# Patient Record
Sex: Female | Born: 1975 | Race: White | Hispanic: No | Marital: Married | Smoking: Former smoker
Health system: Southern US, Community
[De-identification: ages and names within clinical notes are randomized; demographics above are authoritative.]

## PROBLEM LIST (undated history)

## (undated) DIAGNOSIS — F99 Mental disorder, not otherwise specified: Secondary | ICD-10-CM

## (undated) HISTORY — PX: OTHER SURGICAL HISTORY: SHX169

## (undated) HISTORY — DX: Mental disorder, not otherwise specified: F99

---

## 2000-08-24 ENCOUNTER — Other Ambulatory Visit: Admission: RE | Admit: 2000-08-24 | Discharge: 2000-08-24 | Payer: Self-pay | Admitting: Obstetrics and Gynecology

## 2004-08-19 ENCOUNTER — Ambulatory Visit (HOSPITAL_COMMUNITY): Admission: RE | Admit: 2004-08-19 | Discharge: 2004-08-19 | Payer: Self-pay | Admitting: Obstetrics & Gynecology

## 2006-12-08 ENCOUNTER — Ambulatory Visit (HOSPITAL_COMMUNITY): Admission: RE | Admit: 2006-12-08 | Discharge: 2006-12-08 | Payer: Self-pay | Admitting: Internal Medicine

## 2006-12-20 ENCOUNTER — Ambulatory Visit (HOSPITAL_COMMUNITY): Admission: RE | Admit: 2006-12-20 | Discharge: 2006-12-20 | Payer: Self-pay | Admitting: Internal Medicine

## 2007-06-15 ENCOUNTER — Other Ambulatory Visit: Admission: RE | Admit: 2007-06-15 | Discharge: 2007-06-15 | Payer: Self-pay | Admitting: Obstetrics and Gynecology

## 2008-05-22 ENCOUNTER — Other Ambulatory Visit: Admission: RE | Admit: 2008-05-22 | Discharge: 2008-05-22 | Payer: Self-pay | Admitting: Obstetrics & Gynecology

## 2008-11-24 ENCOUNTER — Inpatient Hospital Stay (HOSPITAL_COMMUNITY): Admission: AD | Admit: 2008-11-24 | Discharge: 2008-11-26 | Payer: Self-pay | Admitting: Obstetrics and Gynecology

## 2009-08-12 ENCOUNTER — Other Ambulatory Visit: Admission: RE | Admit: 2009-08-12 | Discharge: 2009-08-12 | Payer: Self-pay | Admitting: Obstetrics and Gynecology

## 2010-05-21 LAB — CBC
HCT: 37.6 % (ref 36.0–46.0)
Hemoglobin: 12.7 g/dL (ref 12.0–15.0)
Hemoglobin: 13.8 g/dL (ref 12.0–15.0)
MCV: 95.6 fL (ref 78.0–100.0)
MCV: 96.6 fL (ref 78.0–100.0)
Platelets: 205 10*3/uL (ref 150–400)
RDW: 13.3 % (ref 11.5–15.5)

## 2010-05-21 LAB — RPR: RPR Ser Ql: NONREACTIVE

## 2010-09-16 ENCOUNTER — Other Ambulatory Visit (HOSPITAL_COMMUNITY)
Admission: RE | Admit: 2010-09-16 | Discharge: 2010-09-16 | Disposition: A | Discharge status: Home / Self Care | Payer: BC Managed Care – PPO | Source: Ambulatory Visit | Attending: Obstetrics and Gynecology | Admitting: Obstetrics and Gynecology

## 2010-09-16 DIAGNOSIS — Z01419 Encounter for gynecological examination (general) (routine) without abnormal findings: Secondary | ICD-10-CM | POA: Insufficient documentation

## 2011-09-20 ENCOUNTER — Other Ambulatory Visit (HOSPITAL_COMMUNITY)
Admission: RE | Admit: 2011-09-20 | Discharge: 2011-09-20 | Disposition: A | Discharge status: Home / Self Care | Payer: BC Managed Care – PPO | Source: Ambulatory Visit | Attending: Obstetrics and Gynecology | Admitting: Obstetrics and Gynecology

## 2011-09-20 DIAGNOSIS — Z01419 Encounter for gynecological examination (general) (routine) without abnormal findings: Secondary | ICD-10-CM | POA: Insufficient documentation

## 2011-09-20 DIAGNOSIS — Z1151 Encounter for screening for human papillomavirus (HPV): Secondary | ICD-10-CM | POA: Insufficient documentation

## 2012-10-10 ENCOUNTER — Other Ambulatory Visit: Payer: Self-pay | Admitting: Obstetrics & Gynecology

## 2012-10-19 ENCOUNTER — Other Ambulatory Visit (HOSPITAL_COMMUNITY)
Admission: RE | Admit: 2012-10-19 | Discharge: 2012-10-19 | Disposition: A | Discharge status: Home / Self Care | Payer: BC Managed Care – PPO | Source: Ambulatory Visit | Attending: Obstetrics & Gynecology | Admitting: Obstetrics & Gynecology

## 2012-10-19 ENCOUNTER — Ambulatory Visit (INDEPENDENT_AMBULATORY_CARE_PROVIDER_SITE_OTHER): Payer: BC Managed Care – PPO | Admitting: Obstetrics & Gynecology

## 2012-10-19 ENCOUNTER — Encounter: Payer: Self-pay | Admitting: Obstetrics & Gynecology

## 2012-10-19 VITALS — BP 100/70 | Ht 65.2 in | Wt 145.0 lb

## 2012-10-19 DIAGNOSIS — Z01419 Encounter for gynecological examination (general) (routine) without abnormal findings: Secondary | ICD-10-CM

## 2012-10-19 DIAGNOSIS — Z1151 Encounter for screening for human papillomavirus (HPV): Secondary | ICD-10-CM | POA: Insufficient documentation

## 2012-10-19 MED ORDER — ESCITALOPRAM OXALATE 10 MG PO TABS: 10.0000 mg | ORAL_TABLET | Freq: Every day | ORAL | Status: DC

## 2012-10-19 NOTE — Patient Instructions (Signed)
Premenstrual Syndrome Premenstrual syndrome (PMS) is a condition that consists of physical, emotional, and behavioral symptoms that affect women of childbearing age. PMS occurs 5 14 days before the start of a menstrual period and often recurs in a predictable pattern. The symptoms go away a few days after the menstrual period starts. PMS can interfere in many ways with normal daily activities and can range from mild to severe. When PMS is considered severe, it may be diagnosed as premenstrual dysphoric disorder (PMDD). A small percentage of women are affected by PMS symptoms and an even smaller percentage of those women are affected by PMDD.  CAUSES  The exact cause of PMS is unknown, but it seems to be related to cyclic hormone changes that happen before menstruation. These hormones are thought to affect chemicals in the brain (serotonin) that can influence a person's mood.  SYMPTOMS  Symptoms of PMS recur consistently from month to month and go away completely after the menstrual period starts. The most common emotional or behavioral symptom is mood swings. These mood swings can be disabling and interfere with normal activities of daily living. Other common symptoms include depression and angry outbursts. Other symptoms may include:   Irritability.  Anxiety.  Crying spells.   Food cravings or appetite changes.   Changes in sexual desire.   Confusion.   Aggression.   Social withdrawal.   Poor concentration. The most common physical symptoms include a sense of bloating, breast pain, headaches, and extreme fatigue. Other physical symptoms include:   Backaches.   Swelling of the hands and feet.   Weight gain.   Hot flashes.  DIAGNOSIS  To make a diagnosis, your caregiver will ask questions to confirm that you are having a pattern of symptoms. Symptoms must:   Be present 5 days before the start of your period and be present at least 3 months in a row.   End within 4 days  after your period starts.   Interfere with some of your normal activities.  Other conditions, such as thyroid disease, depression, and migraine headaches must be ruled out before a diagnosis of PMS is confirmed.  TREATMENT  Your caregiver may suggest ways to maintain a healthy lifestyle, such as exercise. Over-the-counter pain relievers may ease cramps, aches, pains, headaches, and breast tenderness. However, selective serotonin reuptake inhibitors (SSRIs) are medicines that are most beneficial in improving PMS if taken in the second half of the monthly cycle. They may be taken on a daily basis. The most effective oral contraceptive pill used for symptoms of PMS is one that contains the ingredient drospirenone. Taking 4 days off of the pill instead of the usual 7 days also has shown to increase effectiveness.  There are a number of drugs, dietary supplements, vitamins, and water pills (diuretics) which have been suggested to be helpful but have not shown to be of any benefit to improving PMS symptoms.  HOME CARE INSTRUCTIONS   For 2 3 months, write down your symptoms, their severity, and how long they last. This may help your caregiver prescribe the best treatment for your symptoms.  Exercise regularly as suggested by your caregiver.  Eat a regular, well-balanced diet.  Avoid caffeine, alcohol, and tobacco consumption.  Limit salt and salty foods to lessen bloating and fluid retention.  Get enough sleep. Practice relaxation techniques.  Drink enough fluids to keep your urine clear or pale yellow.  Take medicines as directed by your caregiver.  Limit stress.  Take a multivitamin as directed   by your caregiver. Document Released: 01/30/2000 Document Revised: 10/27/2011 Document Reviewed: 06/21/2011 [employer] Patient Information 2014 [employer].  

## 2012-10-19 NOTE — Progress Notes (Signed)
Patient ID: Zoe Hampton, female   DOB: 09/10/1975, 37 y.o.   MRN: [id number] Subjective:     Zoe Hampton is a 37 y.o. female here for a routine exam.  Patient's last menstrual period was 10/13/2012. No obstetric history on file. Current complaints: PMS.  Personal health questionnaire reviewed: no.   Gynecologic History Patient's last menstrual period was 10/13/2012. Contraception: none Last Pap: 2013. Results were: normal Last mammogram: na. Results were: na  Obstetric History OB History  No data available     The following portions of the patient's history were reviewed and updated as appropriate: allergies, current medications, past family history, past medical history, past social history, past surgical history and problem list.  Review of Systems  Review of Systems  Constitutional: Negative for fever, chills, weight loss, malaise/fatigue and diaphoresis.  HENT: Negative for hearing loss, ear pain, nosebleeds, congestion, sore throat, neck pain, tinnitus and ear discharge.   Eyes: Negative for blurred vision, double vision, photophobia, pain, discharge and redness.  Respiratory: Negative for cough, hemoptysis, sputum production, shortness of breath, wheezing and stridor.   Cardiovascular: Negative for chest pain, palpitations, orthopnea, claudication, leg swelling and PND.  Gastrointestinal: negative for abdominal pain. Negative for heartburn, nausea, vomiting, diarrhea, constipation, blood in stool and melena.  Genitourinary: Negative for dysuria, urgency, frequency, hematuria and flank pain.  Musculoskeletal: Negative for myalgias, back pain, joint pain and falls.  Skin: Negative for itching and rash.  Neurological: Negative for dizziness, tingling, tremors, sensory change, speech change, focal weakness, seizures, loss of consciousness, weakness and headaches.  Endo/Heme/Allergies: Negative for environmental allergies and polydipsia. Does not bruise/bleed easily.   Psychiatric/Behavioral: Negative for depression, suicidal ideas, hallucinations, memory loss and substance abuse. The patient is not nervous/anxious and does not have insomnia.        Objective:    Physical Exam  Vitals reviewed. Constitutional: She is oriented to person, place, and time. She appears well-developed and well-nourished.  HENT:  Head: Normocephalic and atraumatic.        Right Ear: External ear normal.  Left Ear: External ear normal.  Nose: Nose normal.  Mouth/Throat: Oropharynx is clear and moist.  Eyes: Conjunctivae and EOM are normal. Pupils are equal, round, and reactive to light. Right eye exhibits no discharge. Left eye exhibits no discharge. No scleral icterus.  Neck: Normal range of motion. Neck supple. No tracheal deviation present. No thyromegaly present.  Cardiovascular: Normal rate, regular rhythm, normal heart sounds and intact distal pulses.  Exam reveals no gallop and no friction rub.   No murmur heard. Respiratory: Effort normal and breath sounds normal. No respiratory distress. She has no wheezes. She has no rales. She exhibits no tenderness.  GI: Soft. Bowel sounds are normal. She exhibits no distension and no mass. There is no tenderness. There is no rebound and no guarding.  Genitourinary:  Breasts no masses non tender no skin or nipple changes      Vulva is normal without lesions Vagina is pink moist without discharge Cervix normal in appearance and pap is done Uterus is normal size shape and contour Adnexa is negative with normal sized ovaries   Musculoskeletal: Normal range of motion. She exhibits no edema and no tenderness.  Neurological: She is alert and oriented to person, place, and time. She has normal reflexes. She displays normal reflexes. No cranial nerve deficit. She exhibits normal muscle tone. Coordination normal.  Skin: Skin is warm and dry. No rash noted. No erythema. No pallor.  Psychiatric:  She has a normal mood and affect. Her  behavior is normal. Judgment and thought content normal.       Assessment:    Healthy female exam.    Plan:    Follow up in: 1 year. Try Lexapro 10 for PMS

## 2012-12-21 ENCOUNTER — Other Ambulatory Visit: Payer: Self-pay

## 2013-01-08 ENCOUNTER — Encounter: Payer: Self-pay | Admitting: Obstetrics & Gynecology

## 2013-01-08 MED ORDER — ESCITALOPRAM OXALATE 10 MG PO TABS: 10.0000 mg | ORAL_TABLET | Freq: Every day | ORAL | Status: DC

## 2013-12-24 ENCOUNTER — Other Ambulatory Visit (INDEPENDENT_AMBULATORY_CARE_PROVIDER_SITE_OTHER): Payer: Managed Care, Other (non HMO)

## 2013-12-24 ENCOUNTER — Ambulatory Visit (INDEPENDENT_AMBULATORY_CARE_PROVIDER_SITE_OTHER): Payer: Managed Care, Other (non HMO) | Admitting: Obstetrics & Gynecology

## 2013-12-24 ENCOUNTER — Other Ambulatory Visit (HOSPITAL_COMMUNITY)
Admission: RE | Admit: 2013-12-24 | Discharge: 2013-12-24 | Disposition: A | Discharge status: Home / Self Care | Payer: Managed Care, Other (non HMO) | Source: Ambulatory Visit | Attending: Obstetrics & Gynecology | Admitting: Obstetrics & Gynecology

## 2013-12-24 ENCOUNTER — Encounter: Payer: Self-pay | Admitting: Obstetrics & Gynecology

## 2013-12-24 VITALS — BP 120/80 | Ht 65.2 in | Wt 147.4 lb

## 2013-12-24 DIAGNOSIS — Z01419 Encounter for gynecological examination (general) (routine) without abnormal findings: Secondary | ICD-10-CM

## 2013-12-24 DIAGNOSIS — R7989 Other specified abnormal findings of blood chemistry: Secondary | ICD-10-CM

## 2013-12-24 DIAGNOSIS — E7800 Pure hypercholesterolemia, unspecified: Secondary | ICD-10-CM

## 2013-12-24 DIAGNOSIS — E039 Hypothyroidism, unspecified: Secondary | ICD-10-CM

## 2013-12-24 DIAGNOSIS — D509 Iron deficiency anemia, unspecified: Secondary | ICD-10-CM

## 2013-12-24 MED ORDER — ESCITALOPRAM OXALATE 10 MG PO TABS: 10.0000 mg | ORAL_TABLET | Freq: Every day | ORAL | Status: DC

## 2013-12-24 NOTE — Progress Notes (Signed)
Patient ID: Zoe RedCeleste L Hampton, female   DOB: 04/20/1975, 38 y.o.   MRN: [id number] Subjective:     Zoe Hampton is a 38 y.o. female here for a routine exam.  Patient's last menstrual period was 12/22/2013. No obstetric history on file. Birth Control Method:  none Menstrual Calendar(currently): regular  Current complaints: none.   Current acute medical issues:  none   Recent Gynecologic History Patient's last menstrual period was 12/22/2013. Last Pap: 2014,  normal Last mammogram: na,    History reviewed. No pertinent past medical history.  Past Surgical History  Procedure Laterality Date  . Removal of   fibroids      OB History    No data available      History   Social History  . Marital Status: Married    Spouse Name: N/A    Number of Children: N/A  . Years of Education: N/A   Social History Main Topics  . Smoking status: Former Games developermoker  . Smokeless tobacco: None  . Alcohol Use: None  . Drug Use: None  . Sexual Activity: Yes   Other Topics Concern  . None   Social History Narrative    Family History  Problem Relation Age of Onset  . Colon cancer Maternal Grandmother     Current outpatient prescriptions: escitalopram (LEXAPRO) 10 MG tablet, Take 1 tablet (10 mg total) by mouth daily., Disp: 30 tablet, Rfl: 11;  escitalopram (LEXAPRO) 10 MG tablet, Take 1 tablet (10 mg total) by mouth daily., Disp: 30 tablet, Rfl: 2  Review of Systems  Review of Systems  Constitutional: Negative for fever, chills, weight loss, malaise/fatigue and diaphoresis.  HENT: Negative for hearing loss, ear pain, nosebleeds, congestion, sore throat, neck pain, tinnitus and ear discharge.   Eyes: Negative for blurred vision, double vision, photophobia, pain, discharge and redness.  Respiratory: Negative for cough, hemoptysis, sputum production, shortness of breath, wheezing and stridor.   Cardiovascular: Negative for chest pain, palpitations, orthopnea, claudication, leg  swelling and PND.  Gastrointestinal: negative for abdominal pain. Negative for heartburn, nausea, vomiting, diarrhea, constipation, blood in stool and melena.  Genitourinary: Negative for dysuria, urgency, frequency, hematuria and flank pain.  Musculoskeletal: Negative for myalgias, back pain, joint pain and falls.  Skin: Negative for itching and rash.  Neurological: Negative for dizziness, tingling, tremors, sensory change, speech change, focal weakness, seizures, loss of consciousness, weakness and headaches.  Endo/Heme/Allergies: Negative for environmental allergies and polydipsia. Does not bruise/bleed easily.  Psychiatric/Behavioral: Negative for depression, suicidal ideas, hallucinations, memory loss and substance abuse. The patient is not nervous/anxious and does not have insomnia.        Objective:  Blood pressure 120/80, height 5' 5.2" (1.656 m), weight 147 lb 6.4 oz (66.86 kg), last menstrual period 12/22/2013.   Physical Exam  Vitals reviewed. Constitutional: She is oriented to person, place, and time. She appears well-developed and well-nourished.  HENT:  Head: Normocephalic and atraumatic.        Right Ear: External ear normal.  Left Ear: External ear normal.  Nose: Nose normal.  Mouth/Throat: Oropharynx is clear and moist.  Eyes: Conjunctivae and EOM are normal. Pupils are equal, round, and reactive to light. Right eye exhibits no discharge. Left eye exhibits no discharge. No scleral icterus.  Neck: Normal range of motion. Neck supple. No tracheal deviation present. No thyromegaly present.  Cardiovascular: Normal rate, regular rhythm, normal heart sounds and intact distal pulses.  Exam reveals no gallop and no friction rub.   No  murmur heard. Respiratory: Effort normal and breath sounds normal. No respiratory distress. She has no wheezes. She has no rales. She exhibits no tenderness.  GI: Soft. Bowel sounds are normal. She exhibits no distension and no mass. There is no  tenderness. There is no rebound and no guarding.  Genitourinary:  Breasts no masses skin changes or nipple changes bilaterally      Vulva is normal without lesions Vagina is pink moist without discharge Cervix normal in appearance and pap is done Uterus is normal size shape and contour Adnexa is negative with normal sized ovaries   Musculoskeletal: Normal range of motion. She exhibits no edema and no tenderness.  Neurological: She is alert and oriented to person, place, and time. She has normal reflexes. She displays normal reflexes. No cranial nerve deficit. She exhibits normal muscle tone. Coordination normal.  Skin: Skin is warm and dry. No rash noted. No erythema. No pallor.  Psychiatric: She has a normal mood and affect. Her behavior is normal. Judgment and thought content normal.       Assessment:    Healthy female exam.    Plan:    Follow up in: 1 year. lexapro 10

## 2013-12-25 LAB — VITAMIN D 25 HYDROXY (VIT D DEFICIENCY, FRACTURES): Vit D, 25-Hydroxy: 30 ng/mL (ref 30–89)

## 2013-12-25 LAB — CYTOLOGY - PAP

## 2013-12-25 LAB — CBC WITH DIFFERENTIAL/PLATELET
BASOS ABS: 0.1 10*3/uL (ref 0.0–0.1)
Basophils Relative: 1 % (ref 0–1)
EOS ABS: 0.2 10*3/uL (ref 0.0–0.7)
EOS PCT: 2 % (ref 0–5)
HCT: 41.7 % (ref 36.0–46.0)
HEMOGLOBIN: 13.9 g/dL (ref 12.0–15.0)
LYMPHS ABS: 1.8 10*3/uL (ref 0.7–4.0)
LYMPHS PCT: 19 % (ref 12–46)
MCH: 30.4 pg (ref 26.0–34.0)
MCHC: 33.3 g/dL (ref 30.0–36.0)
MCV: 91.2 fL (ref 78.0–100.0)
MONOS PCT: 6 % (ref 3–12)
Monocytes Absolute: 0.6 10*3/uL (ref 0.1–1.0)
Neutro Abs: 6.9 10*3/uL (ref 1.7–7.7)
Neutrophils Relative %: 72 % (ref 43–77)
Platelets: 352 10*3/uL (ref 150–400)
RBC: 4.57 MIL/uL (ref 3.87–5.11)
RDW: 12.8 % (ref 11.5–15.5)
WBC: 9.6 10*3/uL (ref 4.0–10.5)

## 2013-12-25 LAB — LIPID PANEL
CHOLESTEROL: 145 mg/dL (ref 0–200)
HDL: 62 mg/dL (ref >39)
LDL Cholesterol: 73 mg/dL (ref 0–99)
Total CHOL/HDL Ratio: 2.3 Ratio
Triglycerides: 49 mg/dL (ref <150)
VLDL: 10 mg/dL (ref 0–40)

## 2013-12-25 LAB — TSH: TSH: 1.004 u[IU]/mL (ref 0.350–4.500)

## 2014-06-24 ENCOUNTER — Other Ambulatory Visit: Payer: Self-pay

## 2014-06-24 DIAGNOSIS — Z1231 Encounter for screening mammogram for malignant neoplasm of breast: Secondary | ICD-10-CM

## 2014-06-28 ENCOUNTER — Ambulatory Visit
Admission: RE | Admit: 2014-06-28 | Discharge: 2014-06-28 | Disposition: A | Discharge status: Home / Self Care | Payer: Managed Care, Other (non HMO) | Source: Ambulatory Visit

## 2014-06-28 DIAGNOSIS — Z1231 Encounter for screening mammogram for malignant neoplasm of breast: Secondary | ICD-10-CM

## 2014-07-05 ENCOUNTER — Ambulatory Visit: Payer: Managed Care, Other (non HMO)

## 2015-04-17 ENCOUNTER — Encounter: Payer: Self-pay | Admitting: Obstetrics & Gynecology

## 2015-04-17 ENCOUNTER — Ambulatory Visit (INDEPENDENT_AMBULATORY_CARE_PROVIDER_SITE_OTHER): Payer: 59 | Admitting: Obstetrics & Gynecology

## 2015-04-17 ENCOUNTER — Other Ambulatory Visit (HOSPITAL_COMMUNITY)
Admission: RE | Admit: 2015-04-17 | Discharge: 2015-04-17 | Disposition: A | Discharge status: Home / Self Care | Payer: 59 | Source: Ambulatory Visit | Attending: Obstetrics & Gynecology | Admitting: Obstetrics & Gynecology

## 2015-04-17 VITALS — BP 110/70 | HR 76 | Ht 65.2 in | Wt 148.0 lb

## 2015-04-17 DIAGNOSIS — Z01419 Encounter for gynecological examination (general) (routine) without abnormal findings: Secondary | ICD-10-CM

## 2015-04-17 MED ORDER — ESCITALOPRAM OXALATE 10 MG PO TABS: 10.0000 mg | ORAL_TABLET | Freq: Every day | ORAL | Status: DC

## 2015-04-17 NOTE — Progress Notes (Signed)
Patient ID: Zoe Hampton, female   DOB: 27-Sep-1975, 40 y.o.   MRN: [id number] Subjective:     Zoe Hampton is a 40 y.o. female here for a routine exam.  Patient's last menstrual period was 03/31/2015. No obstetric history on file. Birth Control Method:  none Menstrual Calendar(currently): regular  Current complaints: wants to restart her lexapro.   Current acute medical issues:  none   Recent Gynecologic History Patient's last menstrual period was 03/31/2015. Last Pap: 2015,  normal Last mammogram: 2016,  normal  History reviewed. No pertinent past medical history.  Past Surgical History  Procedure Laterality Date  . Removal of   fibroids      OB History    No data available      Social History   Social History  . Marital Status: Married    Spouse Name: N/A  . Number of Children: N/A  . Years of Education: N/A   Social History Main Topics  . Smoking status: Former Games developer  . Smokeless tobacco: None  . Alcohol Use: None  . Drug Use: None  . Sexual Activity: Yes   Other Topics Concern  . None   Social History Narrative    Family History  Problem Relation Age of Onset  . Colon cancer Maternal Grandmother      Current outpatient prescriptions:  .  escitalopram (LEXAPRO) 10 MG tablet, Take 1 tablet (10 mg total) by mouth daily., Disp: 30 tablet, Rfl: 11  Review of Systems  Review of Systems  Constitutional: Negative for fever, chills, weight loss, malaise/fatigue and diaphoresis.  HENT: Negative for hearing loss, ear pain, nosebleeds, congestion, sore throat, neck pain, tinnitus and ear discharge.   Eyes: Negative for blurred vision, double vision, photophobia, pain, discharge and redness.  Respiratory: Negative for cough, hemoptysis, sputum production, shortness of breath, wheezing and stridor.   Cardiovascular: Negative for chest pain, palpitations, orthopnea, claudication, leg swelling and PND.  Gastrointestinal: negative for abdominal pain.  Negative for heartburn, nausea, vomiting, diarrhea, constipation, blood in stool and melena.  Genitourinary: Negative for dysuria, urgency, frequency, hematuria and flank pain.  Musculoskeletal: Negative for myalgias, back pain, joint pain and falls.  Skin: Negative for itching and rash.  Neurological: Negative for dizziness, tingling, tremors, sensory change, speech change, focal weakness, seizures, loss of consciousness, weakness and headaches.  Endo/Heme/Allergies: Negative for environmental allergies and polydipsia. Does not bruise/bleed easily.  Psychiatric/Behavioral: Negative for depression, suicidal ideas, hallucinations, memory loss and substance abuse. The patient is not nervous/anxious and does not have insomnia.        Objective:  Blood pressure 110/70, pulse 76, height 5' 5.2" (1.656 m), weight 148 lb (67.132 kg), last menstrual period 03/31/2015.   Physical Exam  Vitals reviewed. Constitutional: She is oriented to person, place, and time. She appears well-developed and well-nourished.  HENT:  Head: Normocephalic and atraumatic.        Right Ear: External ear normal.  Left Ear: External ear normal.  Nose: Nose normal.  Mouth/Throat: Oropharynx is clear and moist.  Eyes: Conjunctivae and EOM are normal. Pupils are equal, round, and reactive to light. Right eye exhibits no discharge. Left eye exhibits no discharge. No scleral icterus.  Neck: Normal range of motion. Neck supple. No tracheal deviation present. No thyromegaly present.  Cardiovascular: Normal rate, regular rhythm, normal heart sounds and intact distal pulses.  Exam reveals no gallop and no friction rub.   No murmur heard. Respiratory: Effort normal and breath sounds normal. No respiratory distress.  She has no wheezes. She has no rales. She exhibits no tenderness.  GI: Soft. Bowel sounds are normal. She exhibits no distension and no mass. There is no tenderness. There is no rebound and no guarding.  Genitourinary:   Breasts no masses skin changes or nipple changes bilaterally      Vulva is normal without lesions Vagina is pink moist without discharge Cervix normal in appearance and pap is done Uterus is normal size shape and contour Adnexa is negative with normal sized ovaries   Musculoskeletal: Normal range of motion. She exhibits no edema and no tenderness.  Neurological: She is alert and oriented to person, place, and time. She has normal reflexes. She displays normal reflexes. No cranial nerve deficit. She exhibits normal muscle tone. Coordination normal.  Skin: Skin is warm and dry. No rash noted. No erythema. No pallor.  Psychiatric: She has a normal mood and affect. Her behavior is normal. Judgment and thought content normal.       Assessment:    Healthy female exam.    Plan:    Follow up in: 1 year.    Meds ordered this encounter  Medications  . escitalopram (LEXAPRO) 10 MG tablet    Sig: Take 1 tablet (10 mg total) by mouth daily.    Dispense:  30 tablet    Refill:  11    No orders of the defined types were placed in this encounter.

## 2015-04-21 LAB — CYTOLOGY - PAP

## 2016-10-04 ENCOUNTER — Other Ambulatory Visit: Payer: Self-pay | Admitting: Internal Medicine

## 2016-10-04 DIAGNOSIS — Z1231 Encounter for screening mammogram for malignant neoplasm of breast: Secondary | ICD-10-CM

## 2016-10-13 ENCOUNTER — Ambulatory Visit
Admission: RE | Admit: 2016-10-13 | Discharge: 2016-10-13 | Disposition: A | Discharge status: Home / Self Care | Payer: 59 | Source: Ambulatory Visit | Attending: Internal Medicine | Admitting: Internal Medicine

## 2016-10-13 DIAGNOSIS — Z1231 Encounter for screening mammogram for malignant neoplasm of breast: Secondary | ICD-10-CM | POA: Diagnosis not present

## 2016-11-09 ENCOUNTER — Other Ambulatory Visit: Payer: Self-pay | Admitting: Obstetrics & Gynecology

## 2016-12-27 ENCOUNTER — Other Ambulatory Visit: Payer: Self-pay

## 2016-12-27 ENCOUNTER — Encounter: Payer: Self-pay | Admitting: Adult Health

## 2016-12-27 ENCOUNTER — Ambulatory Visit (INDEPENDENT_AMBULATORY_CARE_PROVIDER_SITE_OTHER): Payer: 59 | Admitting: Adult Health

## 2016-12-27 VITALS — BP 118/70 | HR 81 | Resp 18 | Ht 65.0 in | Wt 159.0 lb

## 2016-12-27 DIAGNOSIS — Z01419 Encounter for gynecological examination (general) (routine) without abnormal findings: Secondary | ICD-10-CM

## 2016-12-27 DIAGNOSIS — Z1212 Encounter for screening for malignant neoplasm of rectum: Secondary | ICD-10-CM

## 2016-12-27 DIAGNOSIS — Z3009 Encounter for other general counseling and advice on contraception: Secondary | ICD-10-CM | POA: Diagnosis not present

## 2016-12-27 DIAGNOSIS — Z1211 Encounter for screening for malignant neoplasm of colon: Secondary | ICD-10-CM

## 2016-12-27 LAB — HEMOCCULT GUIAC POC 1CARD (OFFICE): Fecal Occult Blood, POC: NEGATIVE

## 2016-12-27 NOTE — Progress Notes (Signed)
Patient ID: Zoe Hampton, female   DOB: 09/08/1975, 41 y.o.   MRN: [id number] History of Present Illness:  Zoe Hampton is a 41 year old white female, married in for a well woman gyn exam, she had a normal pap 04/17/15.She is thinking she may need birth control, even though she had to have IVF with daughter.  PCP is Dr Ouida SillsFagan.   Current Medications, Allergies, Past Medical History, Past Surgical History, Family History and Social History were reviewed in [name] electronic medical record.     Review of Systems: Patient denies any headaches, hearing loss, fatigue, blurred vision, shortness of breath, chest pain, abdominal pain, problems with bowel movements, urination, or intercourse. No joint pain or mood swings.    Physical Exam:BP 118/70 (BP Location: Right Arm, Patient Position: Sitting, Cuff Size: Normal)   Pulse 81   Resp 18   Ht 5\' 5"  (1.651 m)   Wt 159 lb (72.1 kg)   LMP 12/06/2016 (Approximate)   BMI 26.46 kg/m  General:  Well developed, well nourished, no acute distress Skin:  Warm and dry Neck:  Midline trachea, normal thyroid, good ROM, no lymphadenopathy Lungs; Clear to auscultation bilaterally Breast:  No dominant palpable mass, retraction, or nipple discharge Cardiovascular: Regular rate and rhythm Abdomen:  Soft, non tender, no hepatosplenomegaly Pelvic:  External genitalia is normal in appearance, no lesions.  The vagina is normal in appearance. Urethra has no lesions or masses. The cervix is bulbous.  Uterus is felt to be normal size, shape, and contour.  No adnexal masses or tenderness noted.Bladder is non tender, no masses felt. Rectal: Good sphincter tone, no polyps, or hemorrhoids felt.  Hemoccult negative. Extremities/musculoskeletal:  No swelling or varicosities noted, no clubbing or cyanosis Psych:  No mood changes, alert and cooperative,seems happy PHQ 2 score 0. Discussed IUD vs tubal and ablation.  Impression:  1. Well woman exam with routine  gynecological exam   2. Screening for colorectal cancer   3. General counseling and advice on contraceptive management      Plan: Physical in 1 year Pap in 2020 Mammogram yearly Review handouts on IUD and tubal and ablation.  Labs with PCP

## 2016-12-27 NOTE — Patient Instructions (Signed)
What You Need to Know About Female Sterilization Female sterilization is surgery to prevent pregnancy. In this surgery, the fallopian tubes are either blocked or closed off. This prevents eggs from reaching the uterus so that the eggs cannot be fertilized by sperm and you cannot get pregnant. Sterilization is permanent. It should only be done if you are sure that you do not want to be able to have children. What are the sterilization surgery options? There are several kinds of female sterilization surgeries. They include:  Laparoscopic tubal ligation. In this surgery, the fallopian tubes are tied off, sealed with heat, or blocked with a clip, ring, or clamp. A small portion of each fallopian tube may also be removed. This surgery is done through several small cuts (incisions).  Postpartum tubal ligation. This is also called a mini-laparotomy. This surgery is done right after childbirth or 1 or 2 days after childbirth. In this surgery, the fallopian tubes are tied off, sealed with heat, or blocked with a clip, ring, or clamp. A small portion of each fallopian tube may also be removed. The surgery is done through a single incision.  Hysteroscopic sterilization. In this surgery, a tiny, spring-like coil is inserted through the cervix and uterus into the fallopian tubes. The coil causes scarring, which blocks the tubes. After the surgery, contraception should be used for 3 months to allow the scar tissue to form completely.  Is sterilization safe? Generally, sterilization is safe. Complications are rare. However, there are risks. They include:  Bleeding.  Infection.  Reaction to medicine used during the procedure.  Injury to surrounding organs.  Failure of the procedure.  How effective is sterilization? Sterilization is nearly 100% effective, but it can fail. Also, the fallopian tubes can grow back together over time. If this happens, you will be able to get pregnant again. Women who have had  this procedure have a higher chance of having an ectopic pregnancy. An ectopic pregnancy is a pregnancy that happens outside of the uterus. This kind of pregnancy is unsuccessful and can lead to serious bleeding if it is not treated. What are the benefits?  It is usually effective for a lifetime.  It is usually safe.  It does not have the drawbacks of other types of birth control: That means: ? Your hormones are not affected. Because of this, your menstrual periods, sexual desire, and sexual performance will not be affected. ? There are no side effects. What are the drawbacks?  If you change your mind and decide that you want to have children, you may not be able to. Sterilization may be reversed, but a reversal is not always successful.  It does not provide protection against STDs (sexually transmitted diseases).  It increases the chance of having an ectopic pregnancy. This information is not intended to replace advice given to you by your health care provider. Make sure you discuss any questions you have with your health care provider. Document Released: 07/21/2007 Document Revised: 09/25/2015 Document Reviewed: 10/29/2014 Elsevier Interactive Patient Education  2018 [employer] Endometrial Ablation Endometrial ablation is a procedure that destroys the thin inner layer of the lining of the uterus (endometrium). This procedure may be done:  To stop heavy periods.  To stop bleeding that is causing anemia.  To control irregular bleeding.  To treat bleeding caused by small tumors (fibroids) in the endometrium.  This procedure is often an alternative to major surgery, such as removal of the uterus and cervix (hysterectomy). As a result of this  procedure:  You may not be able to have children. However, if you are premenopausal (you have not gone through menopause): ? You may still have a small chance of getting pregnant. ? You will need to use a reliable method of birth control  after the procedure to prevent pregnancy.  You may stop having a menstrual period, or you may have only a small amount of bleeding during your period. Menstruation may return several years after the procedure.  Tell a health care provider about:  Any allergies you have.  All medicines you are taking, including vitamins, herbs, eye drops, creams, and over-the-counter medicines.  Any problems you or family members have had with the use of anesthetic medicines.  Any blood disorders you have.  Any surgeries you have had.  Any medical conditions you have. What are the risks? Generally, this is a safe procedure. However, problems may occur, including:  A hole (perforation) in the uterus or bowel.  Infection of the uterus, bladder, or vagina.  Bleeding.  Damage to other structures or organs.  An air bubble in the lung (air embolus).  Problems with pregnancy after the procedure.  Failure of the procedure.  Decreased ability to diagnose cancer in the endometrium.  What happens before the procedure?  You will have tests of your endometrium to make sure there are no pre-cancerous cells or cancer cells present.  You may have an ultrasound of the uterus.  You may be given medicines to thin the endometrium.  Ask your health care provider about: ? Changing or stopping your regular medicines. This is especially important if you take diabetes medicines or blood thinners. ? Taking medicines such as aspirin and ibuprofen. These medicines can thin your blood. Do not take these medicines before your procedure if your doctor tells you not to.  Plan to have someone take you home from the hospital or clinic. What happens during the procedure?  You will lie on an exam table with your feet and legs supported as in a pelvic exam.  To lower your risk of infection: ? Your health care team will wash or sanitize their hands and put on germ-free (sterile) gloves. ? Your genital area will be  washed with soap.  An IV tube will be inserted into one of your veins.  You will be given a medicine to help you relax (sedative).  A surgical instrument with a light and camera (resectoscope) will be inserted into your vagina and moved into your uterus. This allows your surgeon to see inside your uterus.  Endometrial tissue will be removed using one of the following methods: ? Radiofrequency. This method uses a radiofrequency-alternating electric current to remove the endometrium. ? Cryotherapy. This method uses extreme cold to freeze the endometrium. ? Heated-free liquid. This method uses a heated saltwater (saline) solution to remove the endometrium. ? Microwave. This method uses high-energy microwaves to heat up the endometrium and remove it. ? Thermal balloon. This method involves inserting a catheter with a balloon tip into the uterus. The balloon tip is filled with heated fluid to remove the endometrium. The procedure may vary among health care providers and hospitals. What happens after the procedure?  Your blood pressure, heart rate, breathing rate, and blood oxygen level will be monitored until the medicines you were given have worn off.  As tissue healing occurs, you may notice vaginal bleeding for 4-6 weeks after the procedure. You may also experience: ? Cramps. ? Thin, watery vaginal discharge that is light pink  or brown in color. ? A need to urinate more frequently than usual. ? Nausea.  Do not drive for 24 hours if you were given a sedative.  Do not have sex or insert anything into your vagina until your health care provider approves. Summary  Endometrial ablation is done to treat the many causes of heavy menstrual bleeding.  The procedure may be done only after medications have been tried to control the bleeding.  Plan to have someone take you home from the hospital or clinic. This information is not intended to replace advice given to you by your health care  provider. Make sure you discuss any questions you have with your health care provider. Document Released: 12/12/2003 Document Revised: 02/19/2016 Document Reviewed: 02/19/2016 Elsevier Interactive Patient Education  2017 [employer]

## 2017-07-22 DIAGNOSIS — B850 Pediculosis due to Pediculus humanus capitis: Secondary | ICD-10-CM | POA: Diagnosis not present

## 2017-09-09 DIAGNOSIS — Z0001 Encounter for general adult medical examination with abnormal findings: Secondary | ICD-10-CM | POA: Diagnosis not present

## 2017-09-15 DIAGNOSIS — Z0001 Encounter for general adult medical examination with abnormal findings: Secondary | ICD-10-CM | POA: Diagnosis not present

## 2017-12-05 ENCOUNTER — Other Ambulatory Visit: Payer: Self-pay | Admitting: Internal Medicine

## 2017-12-05 DIAGNOSIS — Z1231 Encounter for screening mammogram for malignant neoplasm of breast: Secondary | ICD-10-CM

## 2018-01-10 ENCOUNTER — Ambulatory Visit: Payer: Self-pay

## 2018-02-23 ENCOUNTER — Ambulatory Visit: Payer: Self-pay

## 2018-03-23 ENCOUNTER — Ambulatory Visit
Admission: RE | Admit: 2018-03-23 | Discharge: 2018-03-23 | Disposition: A | Discharge status: Home / Self Care | Payer: 59 | Source: Ambulatory Visit | Attending: Internal Medicine | Admitting: Internal Medicine

## 2018-03-23 ENCOUNTER — Ambulatory Visit: Payer: Self-pay

## 2018-03-23 DIAGNOSIS — Z1231 Encounter for screening mammogram for malignant neoplasm of breast: Secondary | ICD-10-CM | POA: Diagnosis not present

## 2018-10-09 ENCOUNTER — Ambulatory Visit (HOSPITAL_COMMUNITY): Payer: 59 | Attending: Internal Medicine

## 2018-10-09 ENCOUNTER — Other Ambulatory Visit: Payer: Self-pay

## 2018-10-09 ENCOUNTER — Encounter (HOSPITAL_COMMUNITY): Payer: Self-pay

## 2018-10-09 DIAGNOSIS — G8929 Other chronic pain: Secondary | ICD-10-CM | POA: Diagnosis present

## 2018-10-09 DIAGNOSIS — M5442 Lumbago with sciatica, left side: Secondary | ICD-10-CM | POA: Insufficient documentation

## 2018-10-09 NOTE — Therapy (Signed)
[place], [place], [address] Phone: [phone]   Fax:  [phone]  Physical Therapy Evaluation  Patient Details  Name: Zoe Hampton MRN: [id number] Date of Birth: April 14, 1975 Referring Provider (PT): Asencion Noble, MD   Encounter Date: 10/09/2018  PT End of Session - 10/09/18 1125    Visit Number  1    Number of Visits  8    Date for PT Re-Evaluation  11/10/18    Authorization Type  [employer]    Authorization Time Period  10/09/18 to 11/10/18    PT Start Time  1115    PT Stop Time  1155    PT Time Calculation (min)  40 min    Activity Tolerance  Patient tolerated treatment well    Behavior During Therapy  [facility] for tasks assessed/performed       Past Medical History:  Diagnosis Date  . Mental disorder    anxiety    Past Surgical History:  Procedure Laterality Date  . REMOVAL OF   FIBROIDS      There were no vitals filed for this visit.   Subjective Assessment - 10/09/18 1119    Subjective  Pt reports 2-3 years ago back pain started increasing and she previously had positive results with chiropractor, but unable to afford continuing. Pt reports a specific spot on L side where pain comes and goes, feels like her back will break, manageable pain. Pt reports pain and numbness occasionally go down to back of LLE to mid thigh, not constant or triggered by specific activity. Pt reports not wanting MRI due to covid19. Pt reports able to perform all activities, only limited with occasional exercises and fatigued easily with household chores requiring rest breaks. 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 Pt reports normal daily pain 3/10 with increasing to 7/10 pain once per week. Pt denies changes or loss of b/c.    Limitations  Other (comment)   exercise   How long can you sit comfortably?  no issues    How long can you stand comfortably?   no issues    How long can you walk comfortably?  no issues    Diagnostic tests  MRI on hold    Patient Stated Goals  alleviate some of the pain    Currently in Pain?  No/denies         [facility] PT Assessment - 10/09/18 0001      Assessment   Medical Diagnosis  LBP    Referring Provider (PT)  Asencion Noble, MD    Onset Date/Surgical Date  --   about 2-3 years ago   Next MD Visit  no f/u scheduled    Prior Therapy  No      Precautions   Precautions  None      Restrictions   Weight Bearing Restrictions  No      Balance Screen   Has the patient fallen in the past 6 months  No    Has the patient had a decrease in activity level because of a fear of falling?   No    Is the patient reluctant to leave their home because of a fear of falling?   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[place]); desk  work 8 hrs/day    Leisure  daughter's sporting events, workout      Cognition   Overall Cognitive Status  Within Functional Limits for tasks assessed      Observation/Other Assessments   Focus on Therapeutic Outcomes (FOTO)   37% limited      Sensation   Light Touch  Appears Intact      Functional Tests   Functional tests  Sit to Stand      Sit to Stand   Comments  7 sec, from chair, no BUE   normal aligment, no compensations, no pain     Tone   Assessment Location  --   negative clonus bil     ROM / Strength   AROM / PROM / Strength  AROM;Strength      AROM   AROM Assessment Site  Lumbar    Lumbar Flexion  WNL    Lumbar Extension  WNL    Lumbar - Right Side Bend  jt line   increased pain on L   Lumbar - Left Side Bend  jt line    Lumbar - Right Rotation  WNL    Lumbar - Left Rotation  WNL      Strength   Strength Assessment Site  Hip;Knee;Ankle    Right Hip Flexion  5/5    Right Hip Extension  4/5    Right Hip ABduction  4+/5    Left Hip Flexion  5/5    Left Hip  Extension  4/5    Left Hip ABduction  4+/5    Right Knee Flexion  5/5    Right Knee Extension  5/5    Left Knee Flexion  5/5    Left Knee Extension  5/5    Right Ankle Dorsiflexion  5/5    Left Ankle Dorsiflexion  5/5      Flexibility   Soft Tissue Assessment /Muscle Length  yes    Hamstrings  90/90: WNL R, 115 deg L    Quadriceps  Ely's: negative bil    ITB  WNL bil    Piriformis  WNL R, decreased mobility L      Palpation   Spinal mobility  normal mobility, denies pain    SI assessment   denies tenderness to palpation    Palpation comment  L lumbar paraspinals tender to palpation, increased pain with palpation and       Special Tests    Special Tests  Lumbar    Lumbar Tests  Slump Test;Straight Leg Raise      Slump test   Findings  Positive    Side  Left    Comment  "a little pulling through back of my thigh"      Straight Leg Raise   Findings  Negative    Comment  bil      Ambulation/Gait   Gait Comments  observed pt ambulate in/out of clinig with normal gait pattern, no AD and no balance deficits noted         Objective measurements completed on examination: See above findings.        PT Education - 10/09/18 1206    Education Details  Assessment findings, POC, FOTO findings, initaited HEP    Person(s) Educated  Patient    Methods  Explanation;Demonstration;Handout    Comprehension  Verbalized understanding;Returned demonstration       PT Short Term Goals - 10/09/18 1211      PT SHORT TERM GOAL #1  Title  Pt will be independent with HEP, perform consistently and update PRN.    Time  2    Period  Weeks    Status  New    Target Date  10/23/18      PT SHORT TERM GOAL #2   Title  Pt will report 5/10 pain at worst with household chores in order to improve ability to complete laundry and mopping.    Time  2    Period  Weeks    Status  New        PT Long Term Goals - 10/09/18 1212      PT LONG TERM GOAL #1   Title  Pt will report 2/10 pain at  worst with household chores in order to improve ability to complete laundry and mopping.    Time  4    Period  Weeks    Status  New    Target Date  11/10/18      PT LONG TERM GOAL #2   Title  Pt will demo improved L hamstring and piriformis flexibility to improve reduce pain and improve ability to perform functional activities.    Time  4    Period  Weeks    Status  New      PT LONG TERM GOAL #3   Title  Pt will improve strength by 1/2 grade to improve ability to reduce pain with household chores and exercise routines.    Time  4    Period  Weeks    Status  New      PT LONG TERM GOAL #4   Title  Pt will report no limitations with exercise program to improve QoL.    Time  4    Period  Weeks    Status  New             Plan - 10/09/18 1208    Clinical Impression Statement  Pt is a pleasant 43YO female with c/o left sided back pain and occasional radiating symptoms down posterior L leg. Pt demonstrates decreased strength in bil hip abductors and extensors with decreased L hamstring and pirifmoris flexibility. Pt without difficulty performing STS reps or pain. Pt lumbar AROM WNL in all directions with increased L sided pain with R sidebending. Pt with negative SLR test and clonus bilaterally, with positive L slump test. Educated pt on HEP, demo'd exercises and pt able to perform correctly in response. Pt would benefit from skilled PT interventions to improve strength, AROM, reduce muscle restrictions, improve body mechanics with functional activities and reduce overall pain to improve overall QoL.    Personal Factors and Comorbidities  Age;Time since onset of injury/illness/exacerbation    Examination-Activity Limitations  Other   exercising   Examination-Participation Restrictions  Cleaning;Laundry    Stability/Clinical Decision Making  Stable/Uncomplicated    Clinical Decision Making  Low    Rehab Potential  Good    PT Frequency  2x / week    PT Duration  4 weeks    PT  Treatment/Interventions  ADLs/Self Care Home Management;Aquatic Therapy;Biofeedback;Cryotherapy;Electrical Stimulation;Iontophoresis 4mg /ml Dexamethasone;Moist Heat;Traction;Ultrasound;Gait training;Stair training;Functional mobility training;Therapeutic activities;Therapeutic exercise;Balance training;Neuromuscular re-education;Patient/family education;Orthotic Fit/Training;Manual techniques;Passive range of motion;Dry needling;Taping;Spinal Manipulations;Joint Manipulations    PT Next Visit Plan  Review goals, SLS balance assessment. Initiated core strengthening and BLE hip strengthening. Progress to lifting mechanics. Stretching throughout L hip musculature. Consider dry needling for muscle spasms.    PT Home Exercise Plan  Eval: LTR, QL doorway stretch, supine figure  4 stretch    Consulted and Agree with Plan of Care  Patient       Patient will benefit from skilled therapeutic intervention in order to improve the following deficits and impairments:  Decreased strength, Increased fascial restricitons, Increased muscle spasms, Impaired flexibility, Impaired sensation, Improper body mechanics, Pain  Visit Diagnosis: Chronic bilateral low back pain with left-sided sciatica - Plan: PT plan of care cert/re-cert     Problem List Patient Active Problem List   Diagnosis Date Noted  . General counseling and advice on contraceptive management 12/27/2016      Domenick Bookbinderori Dwayne Begay PT, DPT 10/09/18, 12:21 PM [phone]  [facility] [address] [place], [place], [address] Phone: [phone]   Fax:  [phone]  Name: Zoe Hampton MRN: [id number] Date of Birth: 10/12/1975

## 2018-10-13 ENCOUNTER — Telehealth (HOSPITAL_COMMUNITY): Payer: Self-pay | Admitting: Internal Medicine

## 2018-10-13 NOTE — Telephone Encounter (Signed)
10/13/18  pt called to reschedule because she had no one to stay with her daughter - rescheduled for Thursday with cindy

## 2018-10-16 ENCOUNTER — Ambulatory Visit (HOSPITAL_COMMUNITY): Payer: 59 | Admitting: Physical Therapy

## 2018-10-17 ENCOUNTER — Telehealth (HOSPITAL_COMMUNITY): Payer: Self-pay

## 2018-10-17 NOTE — Telephone Encounter (Signed)
PT CALLED TO CANCEL TOMORROW'S APPT DUE TO SHE HAS TO WORK.

## 2018-10-18 ENCOUNTER — Ambulatory Visit (HOSPITAL_COMMUNITY): Payer: 59

## 2018-10-19 ENCOUNTER — Ambulatory Visit (HOSPITAL_COMMUNITY): Payer: 59 | Admitting: Physical Therapy

## 2018-10-24 ENCOUNTER — Encounter (HOSPITAL_COMMUNITY): Payer: 59

## 2018-10-24 ENCOUNTER — Ambulatory Visit (HOSPITAL_COMMUNITY): Payer: 59 | Attending: Internal Medicine

## 2018-10-24 ENCOUNTER — Encounter (HOSPITAL_COMMUNITY): Payer: Self-pay

## 2018-10-24 ENCOUNTER — Other Ambulatory Visit: Payer: Self-pay

## 2018-10-24 DIAGNOSIS — G8929 Other chronic pain: Secondary | ICD-10-CM | POA: Insufficient documentation

## 2018-10-24 DIAGNOSIS — M5442 Lumbago with sciatica, left side: Secondary | ICD-10-CM | POA: Diagnosis present

## 2018-10-24 NOTE — Therapy (Signed)
[facility] [address] [place], [place], [address] Phone: [phone]   Fax:  [phone]  Physical Therapy Treatment  Patient Details  Name: Zoe RedCeleste L Ruda MRN: [id number] Date of Birth: 10/24/1975 Referring Provider (PT): Carylon Perchesoy Fagan, MD   Encounter Date: 10/24/2018  PT End of Session - 10/24/18 0841    Visit Number  2    Number of Visits  8    Date for PT Re-Evaluation  11/10/18    Authorization Type  [employer]    Authorization Time Period  10/09/18 to 11/10/18    Authorization - Visit Number  2    Authorization - Number of Visits  60   60 visit limit, calendar year   PT Start Time  0833    PT Stop Time  0911    PT Time Calculation (min)  38 min    Activity Tolerance  Patient tolerated treatment well    Behavior During Therapy  [facility] for tasks assessed/performed       Past Medical History:  Diagnosis Date  . Mental disorder    anxiety    Past Surgical History:  Procedure Laterality Date  . REMOVAL OF   FIBROIDS      There were no vitals filed for this visit.  Subjective Assessment - 10/24/18 0834    Subjective  Pt reports she always has some pain in the morning on Lt lower back.  Reports she has began the HEP, really likes the LTR exercises.    Patient Stated Goals  alleviate some of the pain    Currently in Pain?  No/denies                       [facility] Adult PT Treatment/Exercise - 10/24/18 0001      Exercises   Exercises  Lumbar      Lumbar Exercises: Stretches   Active Hamstring Stretch  2 reps;30 seconds    Active Hamstring Stretch Limitations  supine with hands behind knee    Lower Trunk Rotation  5 reps;10 seconds    Figure 4 Stretch  2 reps;30 seconds    Other Lumbar Stretch Exercise  QL stretch at doorway      Lumbar Exercises: Standing   Other Standing Lumbar Exercises  SLS Lt 27", Rt 60"; vector stance 5x 5"    Other Standing Lumbar Exercises  Standing hip abd and extension  10x each      Lumbar Exercises: Supine   Bridge  10 reps;3 seconds    Bridge Limitations  2 sets      Lumbar Exercises: Sidelying   Hip Abduction  Both;10 reps;3 seconds    Hip Abduction Limitations  cueing to reduce posterior lean             PT Education - 10/24/18 0845    Education Details  Reviewed goals, assured compliance with HEP, reviewed hold times and form    Person(s) Educated  Patient    Methods  Explanation    Comprehension  Verbalized understanding       PT Short Term Goals - 10/09/18 1211      PT SHORT TERM GOAL #1   Title  Pt will be independent with HEP, perform consistently and update PRN.    Time  2    Period  Weeks    Status  New    Target Date  10/23/18      PT SHORT TERM GOAL #2  Title  Pt will report 5/10 pain at worst with household chores in order to improve ability to complete laundry and mopping.    Time  2    Period  Weeks    Status  New        PT Long Term Goals - 10/09/18 1212      PT LONG TERM GOAL #1   Title  Pt will report 2/10 pain at worst with household chores in order to improve ability to complete laundry and mopping.    Time  4    Period  Weeks    Status  New    Target Date  11/10/18      PT LONG TERM GOAL #2   Title  Pt will demo improved L hamstring and piriformis flexibility to improve reduce pain and improve ability to perform functional activities.    Time  4    Period  Weeks    Status  New      PT LONG TERM GOAL #3   Title  Pt will improve strength by 1/2 grade to improve ability to reduce pain with household chores and exercise routines.    Time  4    Period  Weeks    Status  New      PT LONG TERM GOAL #4   Title  Pt will report no limitations with exercise program to improve QoL.    Time  4    Period  Weeks    Status  New            Plan - 10/24/18 4193    Clinical Impression Statement  Reviewed goals, assured compliance with HEP, some cueing for form and hold times. pt able to verbalize  understanding.  Assesed SLS with noted increased difficulty Lt compared to Rt LE.  Session focus on hip mobility and gluteal strengthening.  Pt able to complete full session with no reports of pain.  Some cueing required with form with exercise and stability with standing balance activites.    Personal Factors and Comorbidities  Age;Time since onset of injury/illness/exacerbation    Examination-Activity Limitations  Other   exercising   Examination-Participation Restrictions  Cleaning;Laundry    Stability/Clinical Decision Making  Stable/Uncomplicated    Clinical Decision Making  Low    Rehab Potential  Good    PT Frequency  2x / week    PT Duration  4 weeks    PT Treatment/Interventions  ADLs/Self Care Home Management;Aquatic Therapy;Biofeedback;Cryotherapy;Electrical Stimulation;Iontophoresis 4mg /ml Dexamethasone;Moist Heat;Traction;Ultrasound;Gait training;Stair training;Functional mobility training;Therapeutic activities;Therapeutic exercise;Balance training;Neuromuscular re-education;Patient/family education;Orthotic Fit/Training;Manual techniques;Passive range of motion;Dry needling;Taping;Spinal Manipulations;Joint Manipulations    PT Next Visit Plan  Next session begin 3D hip excursion and squats. Initiated core strengthening and BLE hip strengthening. Progress to lifting mechanics. Stretching throughout L hip musculature. Consider dry needling for muscle spasms.    PT Home Exercise Plan  Eval: LTR, QL doorway stretch, supine figure 4 stretch       Patient will benefit from skilled therapeutic intervention in order to improve the following deficits and impairments:  Decreased strength, Increased fascial restricitons, Increased muscle spasms, Impaired flexibility, Impaired sensation, Improper body mechanics, Pain  Visit Diagnosis: Chronic bilateral low back pain with left-sided sciatica     Problem List Patient Active Problem List   Diagnosis Date Noted  . General counseling and  advice on contraceptive management 12/27/2016   Ihor Austin, [place]; [employer]  Aldona Lento 10/24/2018, 9:16 AM  [facility] [address] [place], [place], [address] Phone: [phone]   Fax:  [phone]  Name: MICKENZIE PRIMACK MRN: [id number] Date of Birth: Feb 13, 1976

## 2018-10-25 ENCOUNTER — Encounter (HOSPITAL_COMMUNITY): Payer: 59 | Admitting: Physical Therapy

## 2018-10-27 ENCOUNTER — Other Ambulatory Visit: Payer: Self-pay

## 2018-10-27 ENCOUNTER — Encounter (HOSPITAL_COMMUNITY): Payer: Self-pay

## 2018-10-27 ENCOUNTER — Ambulatory Visit (HOSPITAL_COMMUNITY): Payer: 59

## 2018-10-27 DIAGNOSIS — M5442 Lumbago with sciatica, left side: Secondary | ICD-10-CM

## 2018-10-27 DIAGNOSIS — G8929 Other chronic pain: Secondary | ICD-10-CM

## 2018-10-27 NOTE — Therapy (Signed)
[facility] [address] [place], [place], [address] Phone: [phone]   Fax:  [phone]  Physical Therapy Treatment  Patient Details  Name: Zoe Hampton MRN: [id number] Date of Birth: 06/11/1975 Referring Provider (PT): Carylon Perchesoy Fagan, MD   Encounter Date: 10/27/2018  PT End of Session - 10/27/18 1127    Visit Number  3    Number of Visits  8    Date for PT Re-Evaluation  11/10/18    Authorization Type  [employer]    Authorization Time Period  10/09/18 to 11/10/18    Authorization - Visit Number  3    Authorization - Number of Visits  60   60 visit limit, calendar year   PT Start Time  1115    PT Stop Time  1155    PT Time Calculation (min)  40 min    Activity Tolerance  Patient tolerated treatment well    Behavior During Therapy  [facility]       Past Medical History:  Diagnosis Date  . Mental disorder    anxiety    Past Surgical History:  Procedure Laterality Date  . REMOVAL OF   FIBROIDS      There were no vitals filed for this visit.  Subjective Assessment - 10/27/18 1126    Subjective  Pt reports continuing beachbody exercise regimen and currently participating in walking competition at work so with increased back discomfort lately.    Limitations  Other (comment)   exercise   How long can you sit comfortably?  no issues    How long can you stand comfortably?  no issues    How long can you walk comfortably?  no issues    Diagnostic tests  MRI on hold    Patient Stated Goals  alleviate some of the pain    Currently in Pain?  No/denies             [facility]/Ex[facility] - 10/27/18 0001      Lumbar Exercises: Stretches   Active Hamstring Stretch  3 reps;30 seconds    Active Hamstring Stretch Limitations  bil, 12" step    Lower Trunk Rotation Limitations  10x10 seconds    Hip Flexor Stretch  3 reps;30 seconds    Hip Flexor Stretch Limitations  bil, half kneel on  foam    Gastroc Stretch  3 reps;30 seconds    Gastroc Stretch Limitations  bil, slant baord      Lumbar Exercises: Standing   Functional Squats  15 reps    Functional Squats Limitations  8#    Other Standing Lumbar Exercises  deadlifts, 8# x10 reps and 13# x10 reps      Lumbar Exercises: Supine   Ab Set  20 reps    AB Set Limitations  TA set with exhale    Heel Slides  10 reps    Heel Slides Limitations  TA set with exhale    Bridge with clamshell  10 reps   2 sets, BTB   Other Supine Lumbar Exercises  straight leg, eccentric lowering/lifting for ab strength, x10 reps      Lumbar Exercises: Sidelying   Clam  Both;15 reps    Clam Limitations  BTB    Hip Abduction  Both;15 reps    Hip Abduction Limitations  BTB, hand infront to prevent posterior lean      Lumbar Exercises: Quadruped   Madcat/Old Horse  10 reps  PT Education - 10/27/18 1127    Education Details  Exercise technique, update HEP    Person(s) Educated  Patient    Methods  Explanation;Demonstration;Handout    Comprehension  Verbalized understanding;Returned demonstration       PT Short Term Goals - 10/27/18 1200      PT SHORT TERM GOAL #1   Title  Pt will be independent with HEP, perform consistently and update PRN.    Time  2    Period  Weeks    Status  Achieved    Target Date  10/23/18      PT SHORT TERM GOAL #2   Title  Pt will report 5/10 pain at worst with household chores in order to improve ability to complete laundry and mopping.    Time  2    Period  Weeks    Status  On-going        PT Long Term Goals - 10/27/18 1200      PT LONG TERM GOAL #1   Title  Pt will report 2/10 pain at worst with household chores in order to improve ability to complete laundry and mopping.    Time  4    Period  Weeks    Status  On-going      PT LONG TERM GOAL #2   Title  Pt will demo improved L hamstring and piriformis flexibility to improve reduce pain and improve ability to perform  functional activities.    Time  4    Period  Weeks    Status  On-going      PT LONG TERM GOAL #3   Title  Pt will improve strength by 1/2 grade to improve ability to reduce pain with household chores and exercise routines.    Time  4    Period  Weeks    Status  On-going      PT LONG TERM GOAL #4   Title  Pt will report no limitations with exercise program to improve QoL.    Time  4    Period  Weeks    Status  On-going            Plan - 10/27/18 1128    Clinical Impression Statement  Began treatment session with stretching due to increased soreness from increasing exercise at home. Initiated lifting exercises with weighted deadlifts and squats with good form, requiring initial verbal cues for glute activation to avoid low back flexion/extension with movements. Added core strengthening for isometric contraction and concentric/eccentric lowering for added strengthening and control. Pt able to maintain low back in contact with mat demonstrating good core control and denies pain with exercises. Pt with hip abd fatigue with sidelying exercises due to resistance band, but able to maintain good form for all reps. Finished with quadruped cat/cow for added spinals and abdominal stretch to improve overall spinal mobility and reduce soreness at EOS. Pt denies pain at EOS. Continue to progress as able.    Personal Factors and Comorbidities  Age;Time since onset of injury/illness/exacerbation    Examination-Activity Limitations  Other   exercising   Examination-Participation Restrictions  Cleaning;Laundry    Stability/Clinical Decision Making  Stable/Uncomplicated    Rehab Potential  Good    PT Frequency  2x / week    PT Duration  4 weeks    PT Treatment/Interventions  ADLs/Self Care Home Management;Aquatic Therapy;Biofeedback;Cryotherapy;Electrical Stimulation;Iontophoresis 4mg /ml Dexamethasone;Moist Heat;Traction;Ultrasound;Gait training;Stair training;Functional mobility training;Therapeutic  activities;Therapeutic exercise;Balance training;Neuromuscular re-education;Patient/family education;Orthotic Fit/Training;Manual techniques;Passive range of motion;Dry  needling;Taping;Spinal Manipulations;Joint Manipulations    PT Next Visit Plan  Progress core strengthening and BLE hip strengthening; higher level activities. Stretching throughout L hip musculature. Consider dry needling for muscle spasms.    PT Home Exercise Plan  Eval: LTR, QL doorway stretch, supine figure 4 stretch    Consulted and Agree with Plan of Care  Patient       Patient will benefit from skilled therapeutic intervention in order to improve the following deficits and impairments:  Decreased strength, Increased fascial restricitons, Increased muscle spasms, Impaired flexibility, Impaired sensation, Improper body mechanics, Pain  Visit Diagnosis: Chronic bilateral low back pain with left-sided sciatica     Problem List Patient Active Problem List   Diagnosis Date Noted  . General counseling and advice on contraceptive management 12/27/2016     Talbot Grumbling PT, DPT 10/27/18, 12:04 PM [place] [address] [place], [place], [address] Phone: [phone]   Fax:  [phone]  Name: Zoe Hampton MRN: [id number] Date of Birth: 02-Jan-1976

## 2018-10-31 ENCOUNTER — Telehealth (HOSPITAL_COMMUNITY): Payer: Self-pay

## 2018-10-31 NOTE — Telephone Encounter (Signed)
Pt agreed to change her apptment time to 9:45 to help with scheduling

## 2018-11-01 ENCOUNTER — Other Ambulatory Visit: Payer: Self-pay

## 2018-11-01 ENCOUNTER — Encounter (HOSPITAL_COMMUNITY): Payer: Self-pay

## 2018-11-01 ENCOUNTER — Ambulatory Visit (HOSPITAL_COMMUNITY): Payer: 59

## 2018-11-01 DIAGNOSIS — M5442 Lumbago with sciatica, left side: Secondary | ICD-10-CM | POA: Diagnosis not present

## 2018-11-01 DIAGNOSIS — G8929 Other chronic pain: Secondary | ICD-10-CM

## 2018-11-01 NOTE — Therapy (Signed)
[facility] [address] [place], [place], [address] Phone: [phone]   Fax:  [phone]  Physical Therapy Treatment  Patient Details  Name: Zoe Hampton MRN: [id number] Date of Birth: Nov 29, 1975 Referring Provider (PT): Carylon Perches, MD   Encounter Date: 11/01/2018  PT End of Session - 11/01/18 1004    Visit Number  4    Number of Visits  8    Date for PT Re-Evaluation  11/10/18    Authorization Type  [employer]    Authorization Time Period  10/09/18 to 11/10/18    Authorization - Visit Number  4    Authorization - Number of Visits  60   60 visit limit, calendar year   PT Start Time  0945    PT Stop Time  1025    PT Time Calculation (min)  40 min    Activity Tolerance  Patient tolerated treatment well    Behavior During Therapy  [facility] for tasks assessed/performed       Past Medical History:  Diagnosis Date  . Mental disorder    anxiety    Past Surgical History:  Procedure Laterality Date  . REMOVAL OF   FIBROIDS      There were no vitals filed for this visit.  Subjective Assessment - 11/01/18 0953    Subjective  pt reports doing yard work yesterday, lifting mulch and soil so today back pain is high.    Limitations  Other (comment)   exercise   How long can you sit comfortably?  no issues    How long can you stand comfortably?  no issues    How long can you walk comfortably?  no issues    Diagnostic tests  MRI on hold    Patient Stated Goals  alleviate some of the pain    Currently in Pain?  Yes    Pain Score  5     Pain Location  Back    Pain Orientation  Lower    Pain Descriptors / Indicators  Sharp   catches         [facility] Adult PT Treatment/Exercise - 11/01/18 0001      Lumbar Exercises: Stretches   Active Hamstring Stretch  3 reps;30 seconds    Active Hamstring Stretch Limitations  bil, 12" step    Hip Flexor Stretch  3 reps;30 seconds    Hip Flexor Stretch Limitations  bil, half kneel on  foam      Lumbar Exercises: Machines for Strengthening   Other Lumbar Machine Exercise  hip thrusters, 30#; leg press with feet wide and ER, push through heels, 30#, x15 reps      Lumbar Exercises: Standing   Other Standing Lumbar Exercises  step back lunges with knee drive up, 5# each hand, H65 reps    Other Standing Lumbar Exercises  squat, stand to oblique side crunch, alternating side, x10 reps      Lumbar Exercises: Supine   Other Supine Lumbar Exercises  straight leg, eccentric lowering/lifting, x10 reps; BLE eccentric lowering with circle up to top, x10 reps; BLE circle out and down, eccentric lifting up, x10 reps      Lumbar Exercises: Quadruped   Plank  forearm plank, 30 sec; forearm plank with alternating hip dips, x10 reps; forearm plank raising to extended elbows, x10 reps      Manual Therapy   Manual Therapy  Soft tissue mobilization    Manual therapy comments  completed seperate from  rest of treatment    Soft tissue mobilization  pt prone, bil lumbar paraspinals, L>R to reduce palpable restrictions and alleviate pain             PT Education - 11/01/18 1003    Education Details  Benefits of manual STM, exercise technique, continue HEP    Person(s) Educated  Patient    Methods  Explanation    Comprehension  Verbalized understanding       PT Short Term Goals - 10/27/18 1200      PT SHORT TERM GOAL #1   Title  Pt will be independent with HEP, perform consistently and update PRN.    Time  2    Period  Weeks    Status  Achieved    Target Date  10/23/18      PT SHORT TERM GOAL #2   Title  Pt will report 5/10 pain at worst with household chores in order to improve ability to complete laundry and mopping.    Time  2    Period  Weeks    Status  On-going        PT Long Term Goals - 10/27/18 1200      PT LONG TERM GOAL #1   Title  Pt will report 2/10 pain at worst with household chores in order to improve ability to complete laundry and mopping.    Time  4     Period  Weeks    Status  On-going      PT LONG TERM GOAL #2   Title  Pt will demo improved L hamstring and piriformis flexibility to improve reduce pain and improve ability to perform functional activities.    Time  4    Period  Weeks    Status  On-going      PT LONG TERM GOAL #3   Title  Pt will improve strength by 1/2 grade to improve ability to reduce pain with household chores and exercise routines.    Time  4    Period  Weeks    Status  On-going      PT LONG TERM GOAL #4   Title  Pt will report no limitations with exercise program to improve QoL.    Time  4    Period  Weeks    Status  On-going            Plan - 11/01/18 1004    Clinical Impression Statement  Pt with increased pain this date due to increase in activity yesterday. Initiated treatment session with manual STM to reduce palpable restrictions and alleviate pain. Added plank variations and LE eccentric control variation for progressed core strengthening without difficulty, but demonstrates fatigue and requiring rest breaks. Progressed bil glute strengthening and hip thrusters and leg press in wide stance with 30#, pt able to perform reps with minimal fatigue at end of set. Added dynamic LE strengthening with core engagement and balance challenge with squats and lunges this date, requiring initial verbal cues to improve form and prevent pain. Pt reports improvement in pain at EOS. Continue to progress as able.    Personal Factors and Comorbidities  Age;Time since onset of injury/illness/exacerbation    Examination-Activity Limitations  Other   exercising   Examination-Participation Restrictions  Cleaning;Laundry    Stability/Clinical Decision Making  Stable/Uncomplicated    Rehab Potential  Good    PT Frequency  2x / week    PT Duration  4 weeks    PT  Treatment/Interventions  ADLs/Self Care Home Management;Aquatic Therapy;Biofeedback;Cryotherapy;Electrical Stimulation;Iontophoresis 4mg /ml Dexamethasone;Moist  Heat;Traction;Ultrasound;Gait training;Stair training;Functional mobility training;Therapeutic activities;Therapeutic exercise;Balance training;Neuromuscular re-education;Patient/family education;Orthotic Fit/Training;Manual techniques;Passive range of motion;Dry needling;Taping;Spinal Manipulations;Joint Manipulations    PT Next Visit Plan  Progress core strengthening and BLE hip strengthening; plank variations and squat variations. Stretching throughout hip musculature. Continue manual STM as needed for pain relief.    PT Home Exercise Plan  Eval: LTR, QL doorway stretch, supine figure 4 stretch    Consulted and Agree with Plan of Care  Patient       Patient will benefit from skilled therapeutic intervention in order to improve the following deficits and impairments:  Decreased strength, Increased fascial restricitons, Increased muscle spasms, Impaired flexibility, Impaired sensation, Improper body mechanics, Pain  Visit Diagnosis: Chronic bilateral low back pain with left-sided sciatica     Problem List Patient Active Problem List   Diagnosis Date Noted  . General counseling and advice on contraceptive management 12/27/2016     Talbot Grumbling PT, DPT 11/01/18, 10:32 AM [place] [address] [place], [place], [address] Phone: [phone]   Fax:  [phone]  Name: Zoe Hampton MRN: [id number] Date of Birth: February 09, 1976

## 2018-11-03 ENCOUNTER — Telehealth (HOSPITAL_COMMUNITY): Payer: Self-pay | Admitting: Internal Medicine

## 2018-11-03 ENCOUNTER — Ambulatory Visit (HOSPITAL_COMMUNITY): Payer: 59 | Admitting: Physical Therapy

## 2018-11-03 NOTE — Telephone Encounter (Signed)
11/03/18  pt left a message to cx today's appt and she said she would call back to reschedule

## 2018-11-08 ENCOUNTER — Ambulatory Visit (HOSPITAL_COMMUNITY): Payer: 59 | Admitting: Physical Therapy

## 2018-11-08 ENCOUNTER — Encounter (HOSPITAL_COMMUNITY): Payer: Self-pay | Admitting: Physical Therapy

## 2018-11-08 ENCOUNTER — Other Ambulatory Visit: Payer: Self-pay

## 2018-11-08 DIAGNOSIS — M5442 Lumbago with sciatica, left side: Secondary | ICD-10-CM

## 2018-11-08 DIAGNOSIS — G8929 Other chronic pain: Secondary | ICD-10-CM

## 2018-11-08 NOTE — Therapy (Signed)
[facility] [address] [place], [place], [address] Phone: [phone]   Fax:  [phone]  Physical Therapy Treatment  Patient Details  Name: Zoe Hampton MRN: [id number] Date of Birth: 1975-11-30 Referring Provider (PT): Carylon Perches, MD   Encounter Date: 11/08/2018  PT End of Session - 11/08/18 1519    Visit Number  5    Number of Visits  8    Date for PT Re-Evaluation  11/10/18    Authorization Type  [employer]    Authorization Time Period  10/09/18 to 11/10/18    Authorization - Visit Number  5    Authorization - Number of Visits  60   60 visit limit, calendar year   PT Start Time  1035    PT Stop Time  1115    PT Time Calculation (min)  40 min    Activity Tolerance  Patient tolerated treatment well    Behavior During Therapy  [facility] for tasks assessed/performed       Past Medical History:  Diagnosis Date  . Mental disorder    anxiety    Past Surgical History:  Procedure Laterality Date  . REMOVAL OF   FIBROIDS      There were no vitals filed for this visit.  Subjective Assessment - 11/08/18 1040    Subjective  Patient reported that she is doing alright and having some pain today.    Limitations  Other (comment)   exercise   How long can you sit comfortably?  no issues    How long can you stand comfortably?  no issues    How long can you walk comfortably?  no issues    Diagnostic tests  MRI on hold    Patient Stated Goals  alleviate some of the pain    Currently in Pain?  Yes    Pain Score  3     Pain Location  Back    Pain Orientation  Left    Pain Descriptors / Indicators  Clance Boll PT Assessment - 11/08/18 0001      Palpation   Spinal mobility  Some thoracic hypomobility noted with CPAs reported some onset of left low back pain with lower thoracic upper lumbar spinal testing                   [facility] Adult PT Treatment/Exercise - 11/08/18 0001      Lumbar Exercises:  Stretches   Passive Hamstring Stretch  3 reps;30 seconds;Right;Left    Passive Hamstring Stretch Limitations  with strap      Lumbar Exercises: Supine   Other Supine Lumbar Exercises  Thoracic spinal mobility 3x10 seconds at 3 segements with foam roller      Lumbar Exercises: Sidelying   Other Sidelying Lumbar Exercises  Thoracic book openers 10x10''       Lumbar Exercises: Quadruped   Madcat/Old Horse  10 reps      Manual Therapy   Manual Therapy  Soft tissue mobilization    Manual therapy comments  completed seperate from rest of treatment    Soft tissue mobilization  pt prone, bil lumbar paraspinals, L>R to reduce palpable restrictions and alleviate pain             PT Education - 11/08/18 1519    Education Details  Purpose and technique of interventions and assessment.    Person(s) Educated  Patient    Methods  Explanation    Comprehension  Verbalized understanding       PT Short Term Goals - 10/27/18 1200      PT SHORT TERM GOAL #1   Title  Pt will be independent with HEP, perform consistently and update PRN.    Time  2    Period  Weeks    Status  Achieved    Target Date  10/23/18      PT SHORT TERM GOAL #2   Title  Pt will report 5/10 pain at worst with household chores in order to improve ability to complete laundry and mopping.    Time  2    Period  Weeks    Status  On-going        PT Long Term Goals - 10/27/18 1200      PT LONG TERM GOAL #1   Title  Pt will report 2/10 pain at worst with household chores in order to improve ability to complete laundry and mopping.    Time  4    Period  Weeks    Status  On-going      PT LONG TERM GOAL #2   Title  Pt will demo improved L hamstring and piriformis flexibility to improve reduce pain and improve ability to perform functional activities.    Time  4    Period  Weeks    Status  On-going      PT LONG TERM GOAL #3   Title  Pt will improve strength by 1/2 grade to improve ability to reduce pain with  household chores and exercise routines.    Time  4    Period  Weeks    Status  On-going      PT LONG TERM GOAL #4   Title  Pt will report no limitations with exercise program to improve QoL.    Time  4    Period  Weeks    Status  On-going            Plan - 11/08/18 1522    Clinical Impression Statement  This session began by spinal mobility assessment with some noted hypomobility in thoracic spine. Did provide patient with thoracic mobility exercises this session including foam roller mobility at 3 sections of thoracic spine, thoracic book openers, and cat/cow pose. Ended session with soft tissue mobilization with patient reporting an improvement in her pain at the end of the session    Personal Factors and Comorbidities  Age;Time since onset of injury/illness/exacerbation    Examination-Activity Limitations  Other   exercising   Examination-Participation Restrictions  Cleaning;Laundry    Stability/Clinical Decision Making  Stable/Uncomplicated    Rehab Potential  Good    PT Frequency  2x / week    PT Duration  4 weeks    PT Treatment/Interventions  ADLs/Self Care Home Management;Aquatic Therapy;Biofeedback;Cryotherapy;Electrical Stimulation;Iontophoresis 4mg /ml Dexamethasone;Moist Heat;Traction;Ultrasound;Gait training;Stair training;Functional mobility training;Therapeutic activities;Therapeutic exercise;Balance training;Neuromuscular re-education;Patient/family education;Orthotic Fit/Training;Manual techniques;Passive range of motion;Dry needling;Taping;Spinal Manipulations;Joint Manipulations    PT Next Visit Plan  Progress core strengthening and BLE hip strengthening; plank variations and squat variations. Stretching throughout hip musculature. Continue manual STM as needed for pain relief.    PT Home Exercise Plan  Eval: LTR, QL doorway stretch, supine figure 4 stretch    Consulted and Agree with Plan of Care  Patient       Patient will benefit from skilled therapeutic  intervention in order to improve the following deficits and impairments:  Decreased strength, Increased fascial restricitons,  Increased muscle spasms, Impaired flexibility, Impaired sensation, Improper body mechanics, Pain  Visit Diagnosis: Chronic bilateral low back pain with left-sided sciatica     Problem List Patient Active Problem List   Diagnosis Date Noted  . General counseling and advice on contraceptive management 12/27/2016   Verne Carrow PT, DPT 3:23 PM, 11/08/18 [phone]  [facility] [address] [place], [place], [address] Phone: [phone]   Fax:  [phone]  Name: DIANARA SMULLEN MRN: [id number] Date of Birth: 12/08/1975

## 2018-11-10 ENCOUNTER — Ambulatory Visit (HOSPITAL_COMMUNITY): Payer: 59

## 2018-11-15 ENCOUNTER — Telehealth (HOSPITAL_COMMUNITY): Payer: Self-pay | Admitting: Internal Medicine

## 2018-11-15 NOTE — Telephone Encounter (Signed)
11/15/18  pt left a message to cx said that she has a work conflict... she will call back to rescehdule

## 2018-11-16 ENCOUNTER — Ambulatory Visit (HOSPITAL_COMMUNITY): Payer: 59

## 2019-01-18 ENCOUNTER — Other Ambulatory Visit: Payer: Self-pay

## 2019-01-18 DIAGNOSIS — Z20822 Contact with and (suspected) exposure to covid-19: Secondary | ICD-10-CM

## 2019-01-20 LAB — NOVEL CORONAVIRUS, NAA: SARS-CoV-2, NAA: NOT DETECTED

## 2019-01-23 ENCOUNTER — Encounter (HOSPITAL_COMMUNITY): Payer: Self-pay

## 2019-01-23 NOTE — Therapy (Signed)
[place], [place], [address] Phone: [phone]   Fax:  [phone]  Patient Details  Name: Zoe Hampton MRN: [id number] Date of Birth: [birth year] Referring Provider:  No ref. provider found  Encounter Date: 01/23/2019   PHYSICAL THERAPY DISCHARGE SUMMARY  Visits from Start of Care: 5  Current functional level related to goals / functional outcomes: See last treatment note 11/08/18   Remaining deficits: See last treatment note 11/08/18  Education / Equipment: See last treatment note 11/08/18 Plan: Patient agrees to discharge.  Patient goals were not met. Patient is being discharged due to not returning since the last visit.  ?????     "11/15/18  pt left a message to cx said that she has a work conflict... she will call back to rescehdule"  Talbot Grumbling PT, DPT 01/23/19, 3:41 PM [place], [place], [address] Phone: [phone]   Fax:  [phone]

## 2019-06-01 ENCOUNTER — Other Ambulatory Visit: Payer: Self-pay | Admitting: Internal Medicine

## 2019-06-01 DIAGNOSIS — Z1231 Encounter for screening mammogram for malignant neoplasm of breast: Secondary | ICD-10-CM

## 2019-06-13 ENCOUNTER — Other Ambulatory Visit: Payer: Self-pay

## 2019-06-13 ENCOUNTER — Ambulatory Visit
Admission: RE | Admit: 2019-06-13 | Discharge: 2019-06-13 | Disposition: A | Discharge status: Home / Self Care | Payer: 59 | Source: Ambulatory Visit | Attending: Internal Medicine | Admitting: Internal Medicine

## 2019-06-13 DIAGNOSIS — Z1231 Encounter for screening mammogram for malignant neoplasm of breast: Secondary | ICD-10-CM

## 2019-11-20 ENCOUNTER — Ambulatory Visit (INDEPENDENT_AMBULATORY_CARE_PROVIDER_SITE_OTHER): Payer: 59 | Admitting: Adult Health

## 2019-11-20 ENCOUNTER — Encounter: Payer: Self-pay | Admitting: Adult Health

## 2019-11-20 ENCOUNTER — Other Ambulatory Visit (HOSPITAL_COMMUNITY)
Admission: RE | Admit: 2019-11-20 | Discharge: 2019-11-20 | Disposition: A | Discharge status: Home / Self Care | Payer: 59 | Source: Ambulatory Visit | Attending: Adult Health | Admitting: Adult Health

## 2019-11-20 VITALS — BP 109/77 | HR 77 | Ht 65.0 in | Wt 158.0 lb

## 2019-11-20 DIAGNOSIS — Z01419 Encounter for gynecological examination (general) (routine) without abnormal findings: Secondary | ICD-10-CM | POA: Insufficient documentation

## 2019-11-20 DIAGNOSIS — N920 Excessive and frequent menstruation with regular cycle: Secondary | ICD-10-CM | POA: Diagnosis not present

## 2019-11-20 DIAGNOSIS — Z1211 Encounter for screening for malignant neoplasm of colon: Secondary | ICD-10-CM | POA: Diagnosis not present

## 2019-11-20 LAB — HEMOCCULT GUIAC POC 1CARD (OFFICE): Fecal Occult Blood, POC: NEGATIVE

## 2019-11-20 MED ORDER — LO LOESTRIN FE 1 MG-10 MCG / 10 MCG PO TABS: 1.0000 | ORAL_TABLET | Freq: Every day | ORAL | 0 refills | Status: DC

## 2019-11-20 NOTE — Progress Notes (Signed)
Patient ID: Zoe Hampton, female   DOB: Jul 09, 1975, 44 y.o.   MRN: [id number] History of Present Illness:  Zoe Hampton is a 44 year old white female,married, G1P1, in for a well woman gyn exam and pap. Periods are heavier and may last 10 days, and having hot flashes too. She had COVID and has had both COVID vaccines.  PCP is Dr Ouida Sills.  Current Medications, Allergies, Past Medical History, Past Surgical History, Family History and Social History were reviewed in [employer] record.     Review of Systems:  Patient denies any headaches, hearing loss, fatigue, blurred vision, shortness of breath, chest pain, abdominal pain, problems with bowel movements, urination, or intercourse. No joint pain or mood swings. See HPI for positives.  Physical Exam:BP 109/77 (BP Location: Left Arm, Patient Position: Sitting, Cuff Size: Normal)    Pulse 77    Ht 5\' 5"  (1.651 m)    Wt 158 lb (71.7 kg)    LMP 11/09/2019 (Exact Date)    BMI 26.29 kg/m  General:  Well developed, well nourished, no acute distress Skin:  Warm and dry Neck:  Midline trachea, normal thyroid, good ROM, no lymphadenopathy Lungs; Clear to auscultation bilaterally Breast:  No dominant palpable mass, retraction, or nipple discharge Cardiovascular: Regular rate and rhythm Abdomen:  Soft, non tender, no hepatosplenomegaly Pelvic:  External genitalia is normal in appearance, no lesions.  The vagina is normal in appearance. Urethra has no lesions or masses. The cervix is bulbous.Pap with high risk HPV genotyping performed.  Uterus is felt to be normal size, shape, and contour.  No adnexal masses or tenderness noted.Bladder is non tender, no masses felt. Rectal: Good sphincter tone, no polyps, or hemorrhoids felt.  Hemoccult negative. Extremities/musculoskeletal:  No swelling or varicosities noted, no clubbing or cyanosis Psych:  No mood changes, alert and cooperative,seems happy AA is 1 Fall risk is low PHQ 9 score is  1   Upstream - 11/20/19 1546      Pregnancy Intention Screening   Does the patient want to become pregnant in the next year? No    Does the patient's partner want to become pregnant in the next year? No    Would the patient like to discuss contraceptive options today? Yes      Contraception Wrap Up   Current Method No Method - Other Reason    End Method Oral Contraceptive   UNSURE   Contraception Counseling Provided Yes         Examination chaperoned by 01/20/20 LPN   Impression and Plan: 1. Encounter for gynecological examination with Papanicolaou smear of cervix Pap sent Physical in 1 year Pap in 3 if normal Mammogram yearly Labs with PCP   2. Menorrhagia with regular cycle Discussed try OCs, denies any migraines with aura,stroke, MI, or DVT or breast cancer Will try lo loestrin, given 4 packs can start today  3. Encounter for screening fecal occult blood testing

## 2019-11-22 LAB — CYTOLOGY - PAP
Comment: NEGATIVE
Diagnosis: NEGATIVE
High risk HPV: NEGATIVE

## 2020-02-13 ENCOUNTER — Other Ambulatory Visit: Payer: Self-pay

## 2020-02-13 ENCOUNTER — Ambulatory Visit (INDEPENDENT_AMBULATORY_CARE_PROVIDER_SITE_OTHER): Payer: 59 | Admitting: Adult Health

## 2020-02-13 ENCOUNTER — Encounter: Payer: Self-pay | Admitting: Adult Health

## 2020-02-13 VITALS — BP 106/72 | HR 74 | Ht 65.5 in | Wt 161.0 lb

## 2020-02-13 DIAGNOSIS — N920 Excessive and frequent menstruation with regular cycle: Secondary | ICD-10-CM | POA: Diagnosis not present

## 2020-02-13 DIAGNOSIS — Z7689 Persons encountering health services in other specified circumstances: Secondary | ICD-10-CM | POA: Diagnosis not present

## 2020-02-13 NOTE — Progress Notes (Signed)
°  Subjective:     Patient ID: Zoe Hampton, female   DOB: 1976/01/30, 44 y.o.   MRN: [id number]  HPI Zoe Hampton is a 44 year old white female, married, G1P1, back in follow up on starting lo Loestrin for heavy periods and she missed taking some when at her moms then just stopped. PCP is Dr Ouida Sills   Review of Systems Periods heavy.but missed taking OCs and then just stopped Reviewed past medical,surgical, social and family history. Reviewed medications and allergies.     Objective:   Physical Exam BP 106/72 (BP Location: Left Arm, Patient Position: Sitting, Cuff Size: Normal)    Pulse 74    Ht 5' 5.5" (1.664 m)    Wt 161 lb (73 kg)    LMP 02/13/2020    BMI 26.38 kg/m  Skin warm and dry. Cardiovascular: regular rate and rhythm.   Fall risk is low  Upstream - 02/13/20 1529      Pregnancy Intention Screening   Does the patient want to become pregnant in the next year? No    Does the patient's partner want to become pregnant in the next year? No    Would the patient like to discuss contraceptive options today? Yes      Contraception Wrap Up   Current Method No Method - Other Reason    End Method Oral Contraceptive    Contraception Counseling Provided Yes          Assessment:     1. Menorrhagia with regular cycle Restart lo Loestrin Sunday, has 2 packs   2. Encounter for menstrual regulation Restart OCs    Plan:     Discussed if she missed OCs could try nuva ring and she declines or mirena IUD, and she is interested, gave handout, call if wants IUD  Follow up prn

## 2020-03-27 ENCOUNTER — Other Ambulatory Visit: Payer: Self-pay

## 2020-03-27 ENCOUNTER — Ambulatory Visit (INDEPENDENT_AMBULATORY_CARE_PROVIDER_SITE_OTHER): Payer: 59 | Admitting: Adult Health

## 2020-03-27 ENCOUNTER — Encounter: Payer: Self-pay | Admitting: Adult Health

## 2020-03-27 VITALS — BP 114/78 | HR 79 | Ht 65.0 in | Wt 159.5 lb

## 2020-03-27 DIAGNOSIS — Z30430 Encounter for insertion of intrauterine contraceptive device: Secondary | ICD-10-CM

## 2020-03-27 DIAGNOSIS — Z3202 Encounter for pregnancy test, result negative: Secondary | ICD-10-CM | POA: Diagnosis not present

## 2020-03-27 LAB — POCT URINE PREGNANCY: Preg Test, Ur: NEGATIVE

## 2020-03-27 MED ORDER — LEVONORGESTREL 20 MCG/24HR IU IUD: INTRAUTERINE_SYSTEM | Freq: Once | INTRAUTERINE | Status: AC

## 2020-03-27 NOTE — Progress Notes (Signed)
Patient ID: Zoe Hampton, female   DOB: 1975/03/09, 46 y.o.   MRN: [id number]   IUD INSERTION Patient name: Zoe Hampton MRN [id number]  Date of birth: [birth year] Subjective Findings:   Zoe Hampton is a 46 y.o. G1P1001 white female,married  being seen today for insertion of a Mirena IUD.  Depression screen [facility] 2/9 11/20/2019 12/27/2016  Decreased Interest 0 0  Down, Depressed, Hopeless 0 0  PHQ - 2 Score 0 0  Altered sleeping 1 -  Tired, decreased energy 0 -  Change in appetite 0 -  Feeling bad or failure about yourself  0 -  Trouble concentrating 0 -  Moving slowly or fidgety/restless 0 -  Suicidal thoughts 0 -  PHQ-9 Score 1 -  Difficult doing work/chores Not difficult at all -    Patient's last menstrual period was 03/22/2020. Last sexual intercourse was about a month ago Last pap10/5/21. Results were: negative for malignancy and HRHPV.  The risks and benefits of the method and placement have been thouroughly reviewed with the patient and all questions were answered.  Specifically the patient is aware of failure rate of 02/998, expulsion of the IUD and of possible perforation.  The patient is aware of irregular bleeding due to the method and understands the incidence of irregular bleeding diminishes with time.  Signed copy of informed consent in chart.  Pertinent History Reviewed:   Reviewed past medical,surgical, social, obstetrical and family history.  Reviewed problem list, medications and allergies. Objective Findings & Procedure:   Vitals:   03/27/20 1459  BP: 114/78  Pulse: 79  Weight: 159 lb 8 oz (72.3 kg)  Height: 5\' 5"  (1.651 m)  Body mass index is 26.54 kg/m.  Results for orders placed or performed in visit on 03/27/20 (from the past 24 hour(s))  POCT urine pregnancy   Collection Time: 03/27/20  3:06 PM  Result Value Ref Range   Preg Test, Ur Negative Negative   Fall risk is low  Upstream - 03/27/20 1502      Pregnancy Intention Screening    Does the patient want to become pregnant in the next year? No    Does the patient's partner want to become pregnant in the next year? No    Would the patient like to discuss contraceptive options today? No      Contraception Wrap Up   Current Method Abstinence    End Method IUD or IUS    Contraception Counseling Provided Yes           Time out was performed.  A plastic lighted  speculum was placed in the vagina.  The cervix was visualized, prepped using Betadine, and grasped with a single tooth tenaculum, when pt asked to cough. The uterus was found to be midline position.  Mirena  IUD placed per manufacturer's recommendations, by Dr 05/25/20. The strings were trimmed to approximately 2-3 cm. The patient tolerated the procedure well.   Informal transvaginal sonogram was performed and the proper placement of the IUD was verified. Has fibroid with calcification seen too.  Chaperone: Despina Hidden LPN Assessment & Plan:   1) Mirena IUD insertion The patient was given post procedure instructions, including signs and symptoms of infection and to check for the strings after each menses or each month, and refraining from intercourse or anything in the vagina for 3 days. She was given a care card with date IUD placed, and date IUD to be removed. She is scheduled for a f/u appointment  in 4 weeks.  Orders Placed This Encounter  Procedures  . POCT urine pregnancy    Return in about 4 weeks (around 04/24/2020) for IUD check with me .  Cyril Mourning NP 03/27/2020 3:33 PM

## 2020-04-24 ENCOUNTER — Ambulatory Visit: Payer: 59 | Admitting: Adult Health

## 2020-05-09 ENCOUNTER — Ambulatory Visit: Payer: 59 | Admitting: Adult Health

## 2020-05-19 ENCOUNTER — Other Ambulatory Visit: Payer: Self-pay | Admitting: Internal Medicine

## 2020-05-19 DIAGNOSIS — Z1231 Encounter for screening mammogram for malignant neoplasm of breast: Secondary | ICD-10-CM

## 2020-05-27 ENCOUNTER — Encounter: Payer: Self-pay | Admitting: Adult Health

## 2020-05-27 ENCOUNTER — Ambulatory Visit (INDEPENDENT_AMBULATORY_CARE_PROVIDER_SITE_OTHER): Payer: 59 | Admitting: Adult Health

## 2020-05-27 ENCOUNTER — Other Ambulatory Visit: Payer: Self-pay

## 2020-05-27 VITALS — BP 100/76 | HR 84 | Ht 65.0 in | Wt 160.5 lb

## 2020-05-27 DIAGNOSIS — Z30431 Encounter for routine checking of intrauterine contraceptive device: Secondary | ICD-10-CM

## 2020-05-27 NOTE — Progress Notes (Signed)
  Subjective:     Patient ID: Zoe Hampton, female   DOB: 03-20-1975, 45 y.o.   MRN: [id number]  HPI Zoe Hampton is a 45 year old white female, married, G1P1 in for IUD check, had Mirena placed 03/27/20.  She has had COVID vaccines x 2,does not have her card today. PCP is Dr Ouida Sills   Review of Systems Periods are much better, really light since getting IUD placed 04/06/20 Reviewed past medical,surgical, social and family history. Reviewed medications and allergies.     Objective:   Physical Exam BP 100/76 (BP Location: Left Arm, Patient Position: Sitting, Cuff Size: Normal)   Pulse 84   Ht 5\' 5"  (1.651 m)   Wt 160 lb 8 oz (72.8 kg)   LMP 05/21/2020 (Approximate)   BMI 26.71 kg/m  Skin warm and dry.Pelvic: external genitalia is normal in appearance no lesions, vagina: tan discharge without odor,urethra has no lesions or masses noted, cervix:smooth and bulbous,+IUD strings, short, uterus: normal size, shape and contour, non tender, no masses felt, adnexa: no masses or tenderness noted. Bladder is non tender and no masses felt.   -fall risk is low  Upstream - 05/27/20 1200      Pregnancy Intention Screening   Does the patient want to become pregnant in the next year? No    Does the patient's partner want to become pregnant in the next year? No    Would the patient like to discuss contraceptive options today? No      Contraception Wrap Up   Current Method IUD or IUS    End Method IUD or IUS    Contraception Counseling Provided No         Examination chaperoned by 07/27/20 LPN  Assessment:     1. IUD check up     Plan:     Physical in 6 months

## 2020-07-09 ENCOUNTER — Ambulatory Visit: Payer: 59

## 2020-09-01 ENCOUNTER — Other Ambulatory Visit: Payer: Self-pay

## 2020-09-01 ENCOUNTER — Ambulatory Visit
Admission: RE | Admit: 2020-09-01 | Discharge: 2020-09-01 | Disposition: A | Discharge status: Home / Self Care | Payer: 59 | Source: Ambulatory Visit | Attending: Internal Medicine | Admitting: Internal Medicine

## 2020-09-01 DIAGNOSIS — Z1231 Encounter for screening mammogram for malignant neoplasm of breast: Secondary | ICD-10-CM

## 2020-09-03 ENCOUNTER — Other Ambulatory Visit: Payer: Self-pay | Admitting: Internal Medicine

## 2020-09-03 DIAGNOSIS — R928 Other abnormal and inconclusive findings on diagnostic imaging of breast: Secondary | ICD-10-CM

## 2020-09-17 ENCOUNTER — Other Ambulatory Visit: Payer: Self-pay | Admitting: Internal Medicine

## 2020-09-17 ENCOUNTER — Other Ambulatory Visit: Payer: Self-pay

## 2020-09-17 ENCOUNTER — Ambulatory Visit
Admission: RE | Admit: 2020-09-17 | Discharge: 2020-09-17 | Disposition: A | Discharge status: Home / Self Care | Payer: 59 | Source: Ambulatory Visit | Attending: Internal Medicine | Admitting: Internal Medicine

## 2020-09-17 DIAGNOSIS — R928 Other abnormal and inconclusive findings on diagnostic imaging of breast: Secondary | ICD-10-CM

## 2020-10-17 ENCOUNTER — Telehealth: Payer: Self-pay

## 2020-10-17 ENCOUNTER — Ambulatory Visit
Admission: EM | Admit: 2020-10-17 | Discharge: 2020-10-17 | Disposition: A | Discharge status: Home / Self Care | Payer: 59 | Attending: Emergency Medicine | Admitting: Emergency Medicine

## 2020-10-17 DIAGNOSIS — J019 Acute sinusitis, unspecified: Secondary | ICD-10-CM

## 2020-10-17 DIAGNOSIS — R059 Cough, unspecified: Secondary | ICD-10-CM

## 2020-10-17 MED ORDER — AMOXICILLIN-POT CLAVULANATE 875-125 MG PO TABS: 1.0000 | ORAL_TABLET | Freq: Two times a day (BID) | ORAL | 0 refills | Status: DC

## 2020-10-17 MED ORDER — BENZONATATE 100 MG PO CAPS: 100.0000 mg | ORAL_CAPSULE | Freq: Three times a day (TID) | ORAL | 0 refills | Status: DC

## 2020-10-17 MED ORDER — AMOXICILLIN-POT CLAVULANATE 875-125 MG PO TABS: 1.0000 | ORAL_TABLET | Freq: Two times a day (BID) | ORAL | 0 refills | Status: AC

## 2020-10-17 NOTE — ED Triage Notes (Signed)
Patient presents to Urgent Care with complaints of cough and sore throat x 1 week. She states her daughter had same symptoms and was tested for covid/strep both negative. She reports some left ear discomfort x 4 days ago. Treating symptoms with OTC cough meds.   Denies fever, n/v, or diarrhea.

## 2020-10-17 NOTE — Discharge Instructions (Addendum)
Get plenty of rest and push fluids Tessalon Perles prescribed for cough Augmentin for sinus infection Use OTC zyrtec for nasal congestion, runny nose, and/or sore throat Use OTC flonase for nasal congestion and runny nose Use medications daily for symptom relief Use OTC medications like ibuprofen or tylenol as needed fever or pain Call or go to the ED if you have any new or worsening symptoms such as fever, worsening cough, shortness of breath, chest tightness, chest pain, turning blue, changes in mental status, etc...  

## 2020-10-17 NOTE — Telephone Encounter (Signed)
Patient called and wants prescription sent to [facility] on [address].

## 2020-10-17 NOTE — ED Provider Notes (Signed)
[facility]   [id number] 10/17/20 Arrival Time: 1146   CC: COVID symptoms  SUBJECTIVE: History from: patient.  Zoe Hampton is a 45 y.o. female who presents with cough, sinus pain/ pressure and sore throat x 1 week.  Daughter with similar symptoms, she was negative for strep and covid.  Has tried OTC medications without relief.  Symptoms are made worse with in the morning and at night.  Reports previous symptoms in the past.   Denies fever, chills, SOB, wheezing, chest pain, nausea, changes in bowel or bladder habits.    ROS: As per HPI.  All other pertinent ROS negative.     Past Medical History:  Diagnosis Date   Mental disorder    anxiety   Past Surgical History:  Procedure Laterality Date   REMOVAL OF   FIBROIDS     Allergies  Allergen Reactions   Codeine    No current facility-administered medications on file prior to encounter.   Current Outpatient Medications on File Prior to Encounter  Medication Sig Dispense Refill   levonorgestrel (MIRENA, 52 MG,) 20 MCG/24HR IUD 1 each by Intrauterine route once.     Social History   Socioeconomic History   Marital status: Married    Spouse name: Not on file   Number of children: Not on file   Years of education: Not on file   Highest education level: Not on file  Occupational History   Not on file  Tobacco Use   Smoking status: Former   Smokeless tobacco: Never  Vaping Use   Vaping Use: Never used  Substance and Sexual Activity   Alcohol use: Yes    Alcohol/week: 0.0 standard drinks    Comment: RARELY   Drug use: No   Sexual activity: Yes    Birth control/protection: I.U.D.  Other Topics Concern   Not on file  Social History Narrative   Not on file   Social Determinants of Health   Financial Resource Strain: Low Risk    Difficulty of Paying Living Expenses: Not hard at all  Food Insecurity: No Food Insecurity   Worried About Programme researcher, broadcasting/film/video in the Last Year: Never true   Ran Out of  Food in the Last Year: Never true  Transportation Needs: No Transportation Needs   Lack of Transportation (Medical): No   Lack of Transportation (Non-Medical): No  Physical Activity: Sufficiently Active   Days of Exercise per Week: 4 days   Minutes of Exercise per Session: 40 min  Stress: Stress Concern Present   Feeling of Stress : To some extent  Social Connections: Moderately Integrated   Frequency of Communication with Friends and Family: More than three times a week   Frequency of Social Gatherings with Friends and Family: More than three times a week   Attends Religious Services: More than 4 times per year   Active Member of [employer] or Organizations: No   Attends Banker Meetings: Never   Marital Status: Married  Catering manager Violence: Not At Risk   Fear of Current or Ex-Partner: No   Emotionally Abused: No   Physically Abused: No   Sexually Abused: No   Family History  Problem Relation Age of Onset   Hydrocephalus Father    Colon cancer Maternal Grandmother    Cancer Paternal Grandfather    Alzheimer's disease Paternal Grandmother     OBJECTIVE:  Vitals:   10/17/20 1201  BP: 109/76  Pulse: (!) 112  Resp: 16  Temp:  98.3 F (36.8 C)  TempSrc: Oral  SpO2: 96%    General appearance: alert; fatigued appearing, nontoxic; speaking in full sentences and tolerating own secretions HEENT: NCAT; Ears: EACs clear, TMs pearly gray; Eyes: PERRL.  EOM grossly intact.Nose: nares patent without rhinorrhea, Throat: oropharynx clear, tonsils non erythematous or enlarged, uvula midline  Neck: supple without LAD Lungs: unlabored respirations, symmetrical air entry; cough: absent; no respiratory distress; CTAB Heart: regular rate and rhythm.  Skin: warm and dry Psychological: alert and cooperative; normal mood and affect   ASSESSMENT & PLAN:  1. Cough   2. Acute non-recurrent sinusitis, unspecified location     Meds ordered this encounter  Medications    benzonatate (TESSALON) 100 MG capsule    Sig: Take 1 capsule (100 mg total) by mouth every 8 (eight) hours.    Dispense:  21 capsule    Refill:  0    Order Specific Question:   Supervising Provider    Answer:   Eustace Moore [[id number]]   amoxicillin-clavulanate (AUGMENTIN) 875-125 MG tablet    Sig: Take 1 tablet by mouth every 12 (twelve) hours for 10 days.    Dispense:  20 tablet    Refill:  0    Order Specific Question:   Supervising Provider    Answer:   Eustace Moore [[id number]]    Get plenty of rest and push fluids Tessalon Perles prescribed for cough Augmentin for sinus infection Use OTC zyrtec for nasal congestion, runny nose, and/or sore throat Use OTC flonase for nasal congestion and runny nose Use medications daily for symptom relief Use OTC medications like ibuprofen or tylenol as needed fever or pain Call or go to the ED if you have any new or worsening symptoms such as fever, worsening cough, shortness of breath, chest tightness, chest pain, turning blue, changes in mental status, etc...   Reviewed expectations re: course of current medical issues. Questions answered. Outlined signs and symptoms indicating need for more acute intervention. Patient verbalized understanding. After Visit Summary given.          Rennis Harding, PA-C 10/17/20 1215

## 2021-03-20 ENCOUNTER — Other Ambulatory Visit: Payer: 59

## 2021-04-16 ENCOUNTER — Ambulatory Visit
Admission: RE | Admit: 2021-04-16 | Discharge: 2021-04-16 | Disposition: A | Discharge status: Home / Self Care | Payer: 59 | Source: Ambulatory Visit | Attending: Internal Medicine | Admitting: Internal Medicine

## 2021-04-16 ENCOUNTER — Other Ambulatory Visit: Payer: Self-pay | Admitting: Internal Medicine

## 2021-04-16 ENCOUNTER — Other Ambulatory Visit: Payer: Self-pay

## 2021-04-16 DIAGNOSIS — R928 Other abnormal and inconclusive findings on diagnostic imaging of breast: Secondary | ICD-10-CM

## 2021-04-16 DIAGNOSIS — N6320 Unspecified lump in the left breast, unspecified quadrant: Secondary | ICD-10-CM

## 2021-10-21 ENCOUNTER — Other Ambulatory Visit: Payer: 59

## 2021-11-03 ENCOUNTER — Other Ambulatory Visit: Payer: 59

## 2021-11-17 ENCOUNTER — Ambulatory Visit
Admission: RE | Admit: 2021-11-17 | Discharge: 2021-11-17 | Disposition: A | Discharge status: Home / Self Care | Payer: 59 | Source: Ambulatory Visit | Attending: Internal Medicine | Admitting: Internal Medicine

## 2021-11-17 DIAGNOSIS — N6320 Unspecified lump in the left breast, unspecified quadrant: Secondary | ICD-10-CM

## 2021-12-04 ENCOUNTER — Ambulatory Visit: Payer: 59 | Admitting: Obstetrics & Gynecology

## 2022-01-22 ENCOUNTER — Ambulatory Visit: Payer: 59 | Admitting: Obstetrics & Gynecology

## 2022-03-08 ENCOUNTER — Ambulatory Visit: Payer: 59 | Admitting: Adult Health

## 2022-04-22 ENCOUNTER — Ambulatory Visit: Payer: 59 | Admitting: Adult Health

## 2022-06-14 ENCOUNTER — Ambulatory Visit: Payer: 59 | Admitting: Adult Health

## 2022-08-02 ENCOUNTER — Ambulatory Visit (INDEPENDENT_AMBULATORY_CARE_PROVIDER_SITE_OTHER): Payer: 59 | Admitting: Adult Health

## 2022-08-02 ENCOUNTER — Encounter: Payer: Self-pay | Admitting: Adult Health

## 2022-08-02 ENCOUNTER — Other Ambulatory Visit (HOSPITAL_COMMUNITY)
Admission: RE | Admit: 2022-08-02 | Discharge: 2022-08-02 | Disposition: A | Discharge status: Home / Self Care | Payer: 59 | Source: Ambulatory Visit | Attending: Obstetrics & Gynecology | Admitting: Obstetrics & Gynecology

## 2022-08-02 VITALS — BP 124/76 | HR 68 | Ht 65.0 in | Wt 181.5 lb

## 2022-08-02 DIAGNOSIS — Z1211 Encounter for screening for malignant neoplasm of colon: Secondary | ICD-10-CM

## 2022-08-02 DIAGNOSIS — Z1212 Encounter for screening for malignant neoplasm of rectum: Secondary | ICD-10-CM | POA: Diagnosis not present

## 2022-08-02 DIAGNOSIS — Z975 Presence of (intrauterine) contraceptive device: Secondary | ICD-10-CM

## 2022-08-02 DIAGNOSIS — Z01419 Encounter for gynecological examination (general) (routine) without abnormal findings: Secondary | ICD-10-CM | POA: Diagnosis present

## 2022-08-02 LAB — HEMOCCULT GUIAC POC 1CARD (OFFICE): Fecal Occult Blood, POC: NEGATIVE

## 2022-08-02 NOTE — Progress Notes (Signed)
Patient ID: Zoe Hampton, female   DOB: 1975/04/02, 47 y.o.   MRN: [id number] History of Present Illness: Zoe Hampton is a 47 year old white female,married, G1P1001,in for a well woman gyn exam and pap.  PCP is Dr Ouida Sills.   Current Medications, Allergies, Past Medical History, Past Surgical History, Family History and Social History were reviewed in [employer] record.     Review of Systems:  Patient denies any headaches, hearing loss, fatigue, blurred vision, shortness of breath, chest pain, abdominal pain, problems with bowel movements, urination, or intercourse. No joint pain or mood swings.    Physical Exam:BP 124/76 (BP Location: Left Arm, Patient Position: Sitting, Cuff Size: Normal)   Pulse 68   Ht 5\' 5"  (1.651 m)   Wt 181 lb 8 oz (82.3 kg)   BMI 30.20 kg/m   General:  Well developed, well nourished, no acute distress Skin:  Warm and dry Neck:  Midline trachea, normal thyroid, good ROM, no lymphadenopathy Lungs; Clear to auscultation bilaterally Breast:  No dominant palpable mass, retraction, or nipple discharge Cardiovascular: Regular rate and rhythm Abdomen:  Soft, non tender, no hepatosplenomegaly Pelvic:  External genitalia is normal in appearance, no lesions.  The vagina is normal in appearance. Urethra has no lesions or masses. The cervix is bulbous, +IUD strings at os, pap with HR HPV genotyping perfomred.  Uterus is felt to be normal size, shape, and contour.  No adnexal masses or tenderness noted.Bladder is non tender, no masses felt. Rectal: Good sphincter tone, no polyps, or hemorrhoids felt.  Hemoccult negative. Extremities/musculoskeletal:  No swelling or varicosities noted, no clubbing or cyanosis Psych:  No mood changes, alert and cooperative,seems happy AA is 1 Fall risk is low    08/02/2022    3:37 PM 11/20/2019    3:47 PM 12/27/2016    4:05 PM  Depression screen PHQ 2/9  Decreased Interest 0 0 0  Down, Depressed, Hopeless 0 0 0   PHQ - 2 Score 0 0 0  Altered sleeping 0 1   Tired, decreased energy 0 0   Change in appetite 0 0   Feeling bad or failure about yourself  0 0   Trouble concentrating 0 0   Moving slowly or fidgety/restless 0 0   Suicidal thoughts 0 0   PHQ-9 Score 0 1   Difficult doing work/chores  Not difficult at all       08/02/2022    3:37 PM 11/20/2019    3:47 PM  GAD 7 : Generalized Anxiety Score  Nervous, Anxious, on Edge 1 1  Control/stop worrying 0 1  Worry too much - different things 1 1  Trouble relaxing 0 0  Restless 0 0  Easily annoyed or irritable 0 0  Afraid - awful might happen 0 0  Total GAD 7 Score 2 3  Anxiety Difficulty  Not difficult at all      Upstream - 08/02/22 1539       Pregnancy Intention Screening   Does the patient want to become pregnant in the next year? No    Does the patient's partner want to become pregnant in the next year? No    Would the patient like to discuss contraceptive options today? No      Contraception Wrap Up   Current Method IUD or IUS    End Method IUD or IUS            Examination chaperoned by Malachy Mood LPN   Impression  and Plan: 1. Encounter for gynecological examination with Papanicolaou smear of cervix Pap sent Pap in 3 years if normal Physical in 1 year Labs with PCP Mammogram was negative 11/17/21 - Cytology - PAP( [facility])  2. Encounter for screening fecal occult blood testing Hemoccult was negative   3. IUD (intrauterine device) in place Mirena was placed 03/27/20  4. Screening for colorectal cancer Referred to Dr Jena Gauss for colonoscopy  - Ambulatory referral to Gastroenterology

## 2022-08-04 LAB — CYTOLOGY - PAP
Comment: NEGATIVE
Diagnosis: NEGATIVE
High risk HPV: NEGATIVE

## 2022-08-11 ENCOUNTER — Encounter: Payer: Self-pay | Admitting: *Deleted

## 2023-01-11 IMAGING — MG MM DIGITAL DIAGNOSTIC UNILAT*L* W/ TOMO W/ CAD
4 series · 4 of 12 positions shown · non-contrast
Comparison: Previous exam(s).

CLINICAL DATA: 46-year-old female presenting for six-month
follow-up of probably benign left breast masses.

EXAM:
DIGITAL DIAGNOSTIC UNILATERAL LEFT MAMMOGRAM WITH TOMOSYNTHESIS AND
CAD; ULTRASOUND LEFT BREAST LIMITED
TECHNIQUE: Left digital diagnostic mammography and breast tomosynthesis was
performed. The images were evaluated with computer-aided detection.;
Targeted ultrasound examination of the left breast was performed.

[L MLO synth-2D]
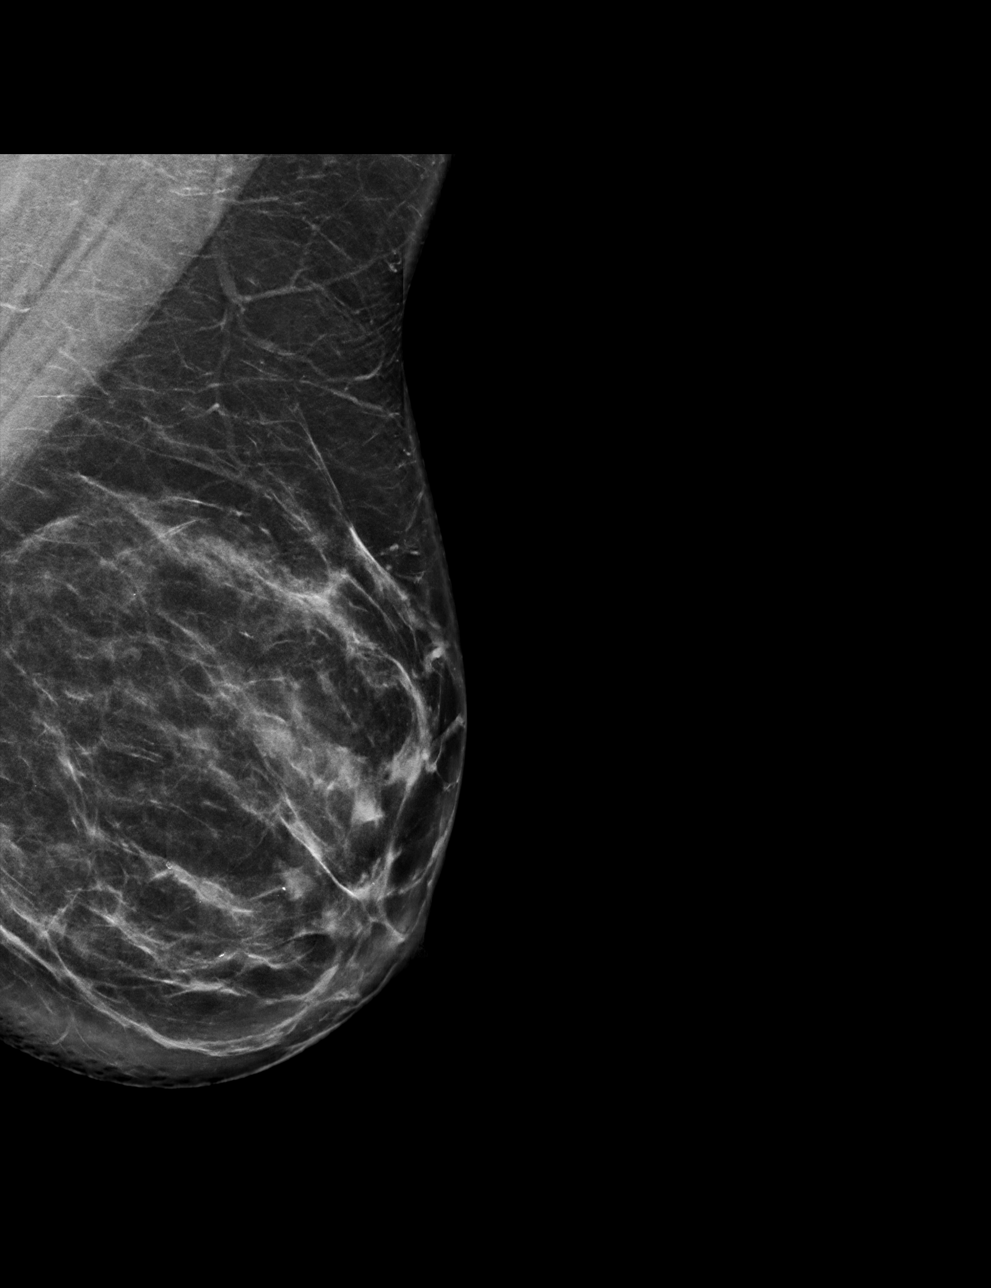

[L CC synth-2D]
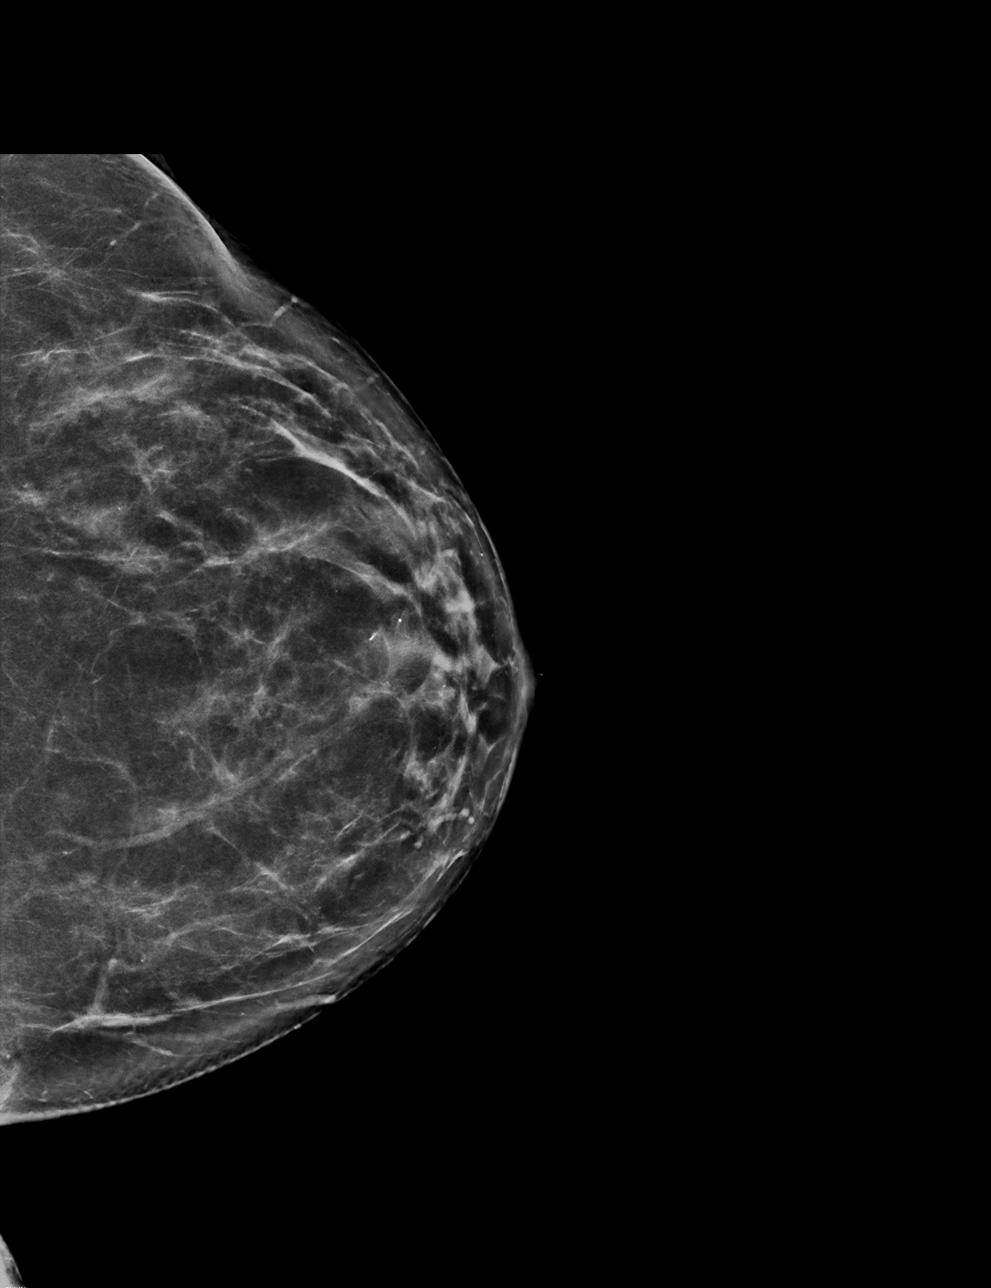

[L MLO tomo · tomo slice 41/80.0]
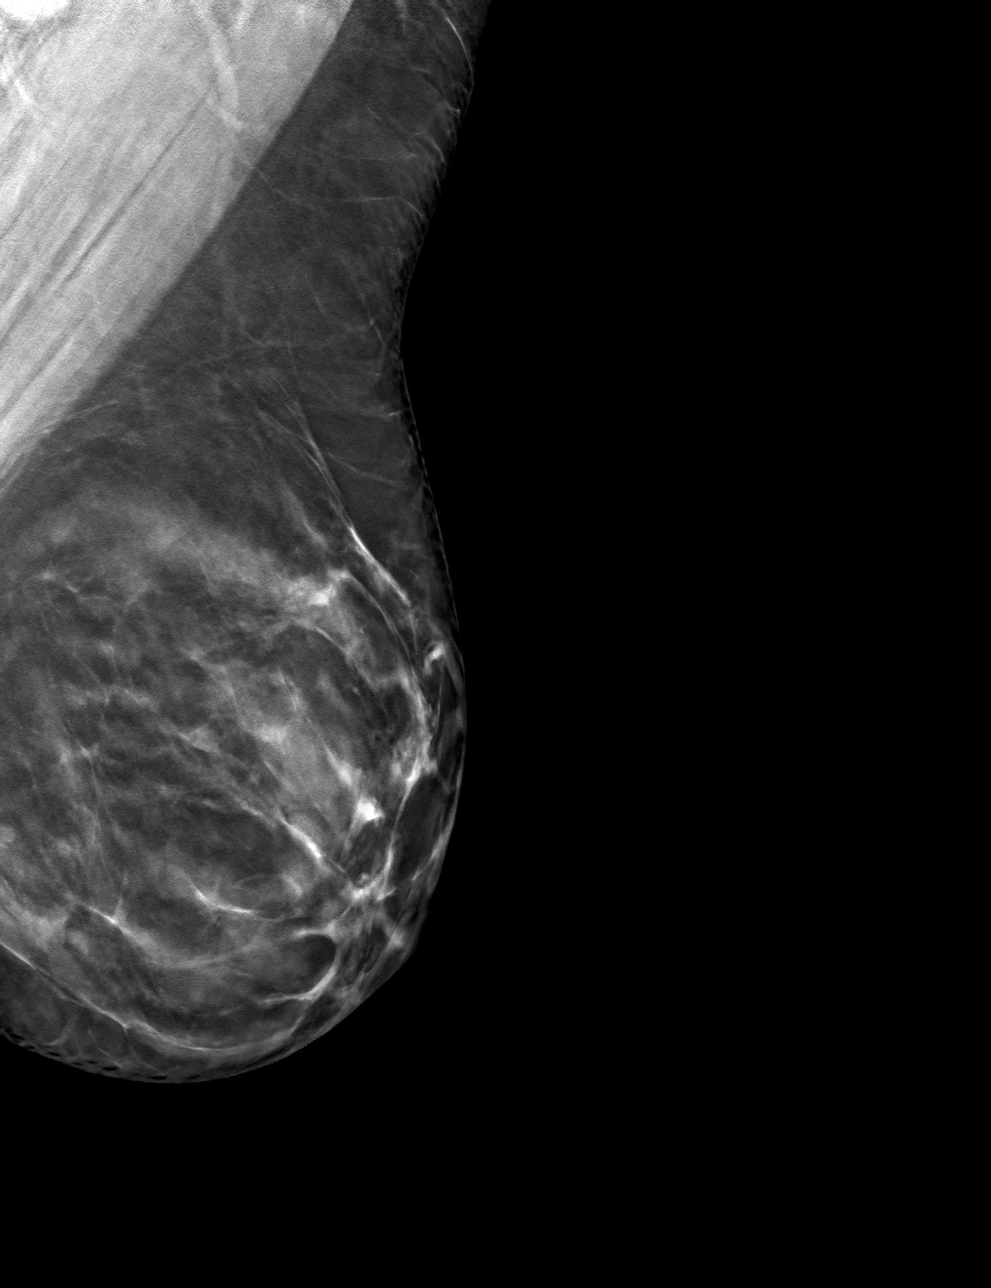

[L CC tomo · tomo slice 39/77.0]
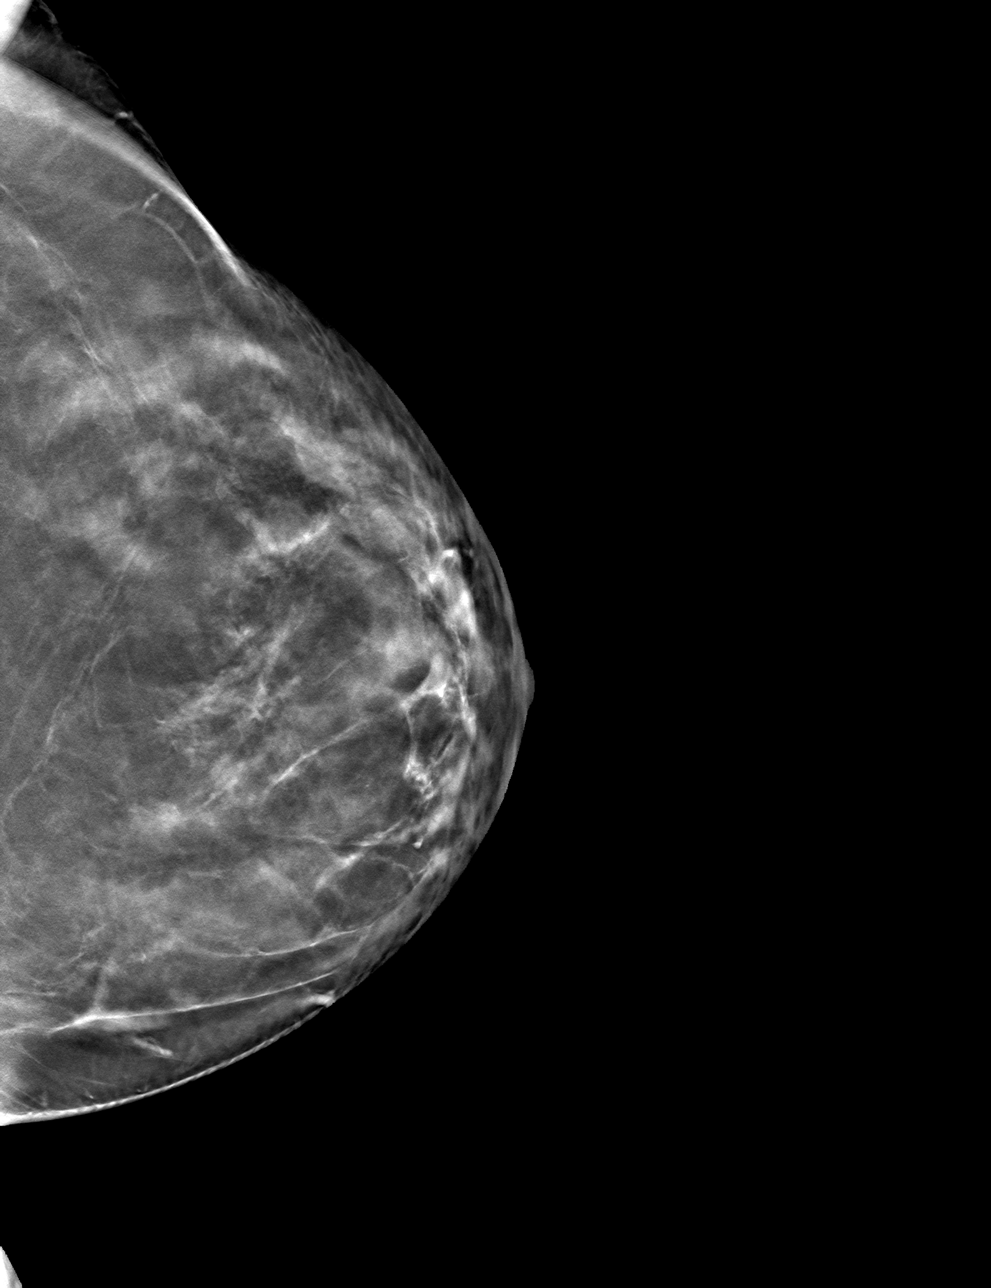

[4 of 12 positions shown; findings below may reference images not displayed]

ACR Breast Density Category b: There are scattered areas of
fibroglandular density.
FINDINGS: Mammogram:

Full field tomosynthesis views of the left breast were performed.
Stable mammographic appearance of the left breast. Unchanged small
oval circumscribed mass in the medial aspect of the breast. There
are no new suspicious findings.

Ultrasound:

Targeted ultrasound is performed in the left breast at 3 o'clock 5
cm from the nipple demonstrating an oval circumscribed hypoechoic
mass with thin septation measuring 0.6 x 0.3 x 0.7 cm, previously
measuring 1.1 x 0.4 x 0.8 cm. There is a similar smaller mass nearby
measuring 0.4 x 0.2 x 0.4 cm, not previously imaged.

At 9 o'clock 7 cm from the nipple there are subcentimeter oval
circumscribed anechoic masses consistent with benign simple cysts.
IMPRESSION: 1. Probably benign masses in the left breast at 3 o'clock, likely
clusters of cysts. The larger of these masses appears slightly
decreased in size from prior.

2.  Benign simple cysts in the left breast at 9 o'clock.

RECOMMENDATION:
Diagnostic bilateral mammogram and left breast ultrasound in 6
months.

I have discussed the findings and recommendations with the patient.
If applicable, a reminder letter will be sent to the patient
regarding the next appointment.

BI-RADS CATEGORY  3: Probably benign.

## 2023-01-11 IMAGING — US US BREAST*L* LIMITED INC AXILLA
1 series · 13 of 18 positions shown · non-contrast
Comparison: Previous exam(s).

CLINICAL DATA: 46-year-old female presenting for six-month
follow-up of probably benign left breast masses.

EXAM:
DIGITAL DIAGNOSTIC UNILATERAL LEFT MAMMOGRAM WITH TOMOSYNTHESIS AND
CAD; ULTRASOUND LEFT BREAST LIMITED
TECHNIQUE: Left digital diagnostic mammography and breast tomosynthesis was
performed. The images were evaluated with computer-aided detection.;
Targeted ultrasound examination of the left breast was performed.

[Series 1: us breast*left* limited inc axilla · 0.05mm/px · 13 of 18 slices shown]
[im 1/18]
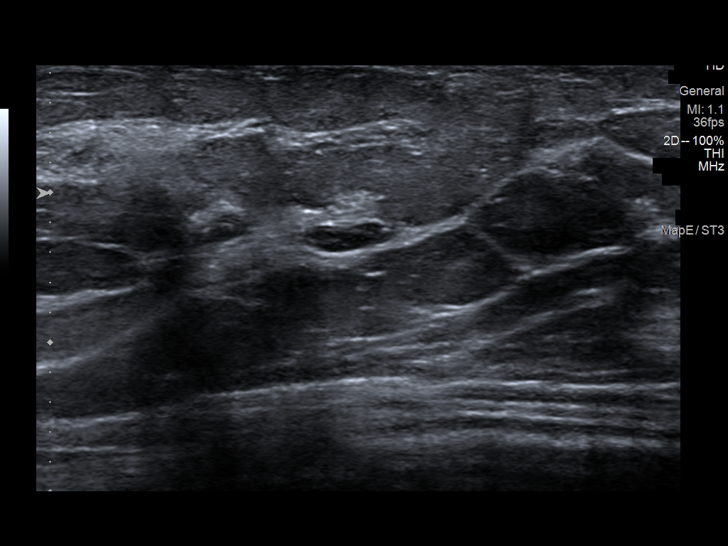
[im 3/18]
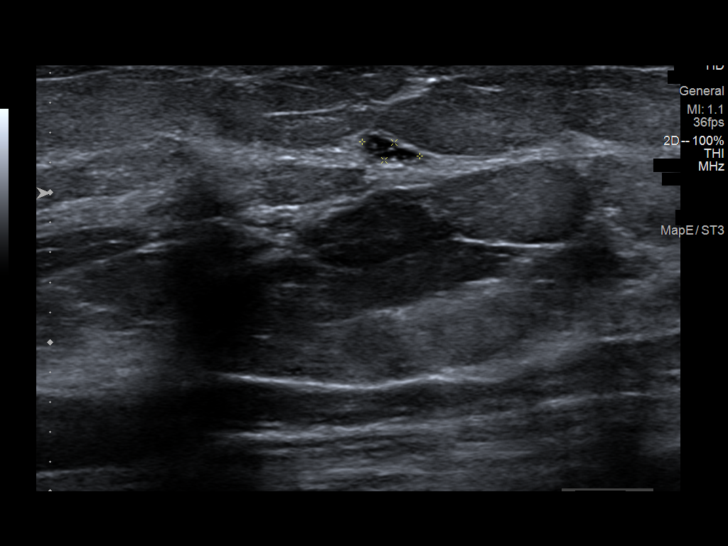
[im 4/18]
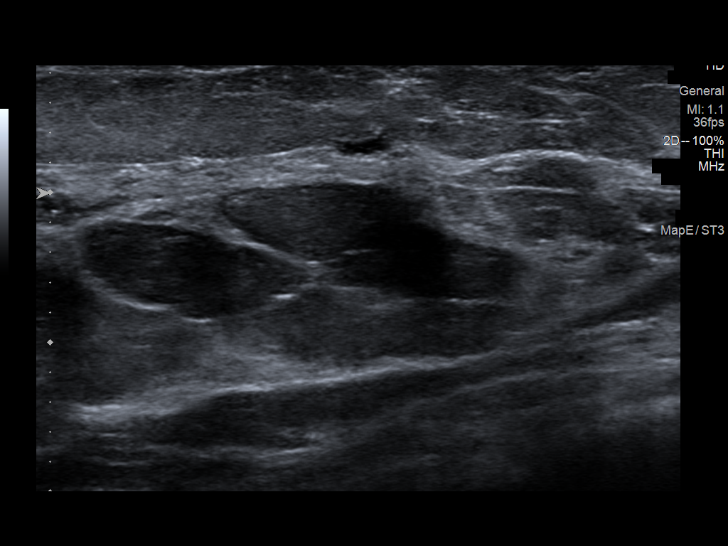
[im 5/18]
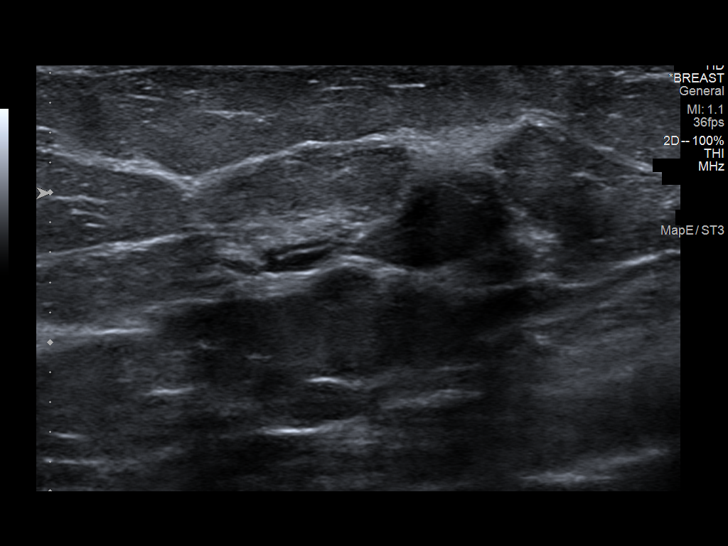
[im 7/18]
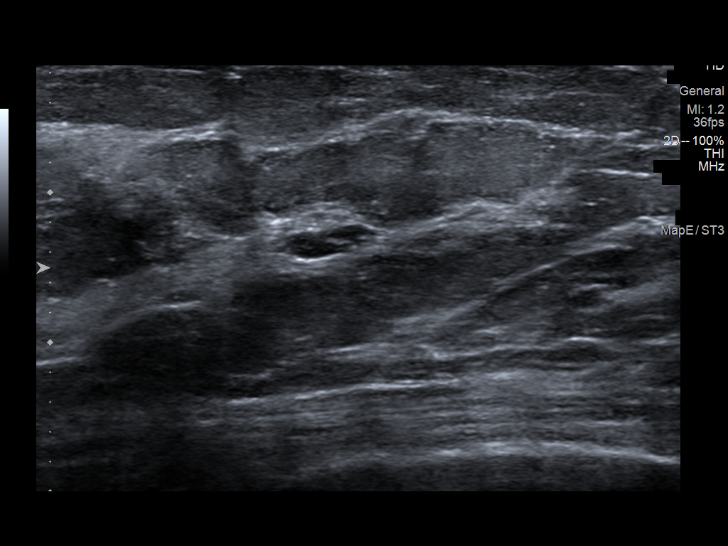
[im 8/18]
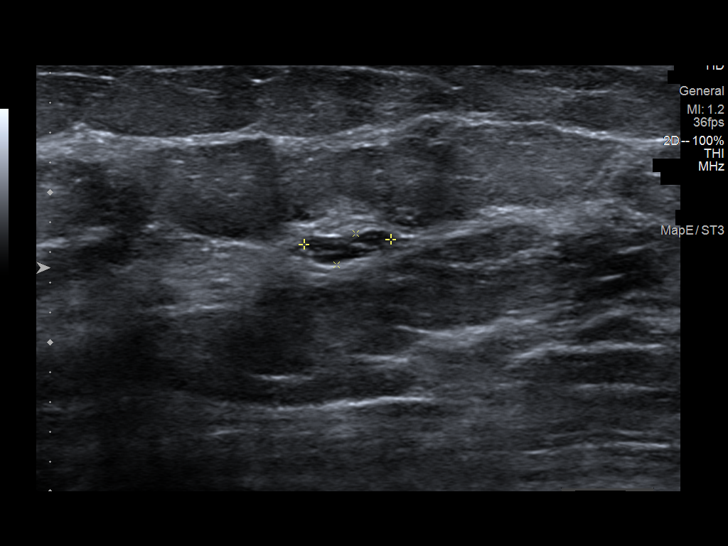
[im 10/18]
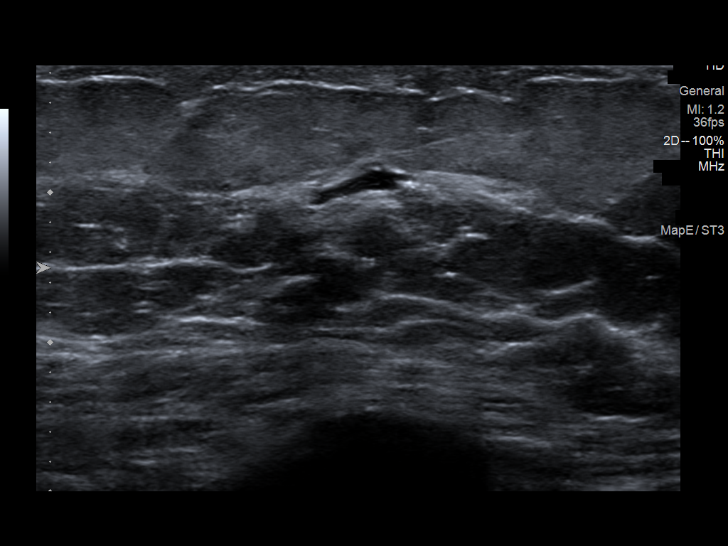
[im 11/18]
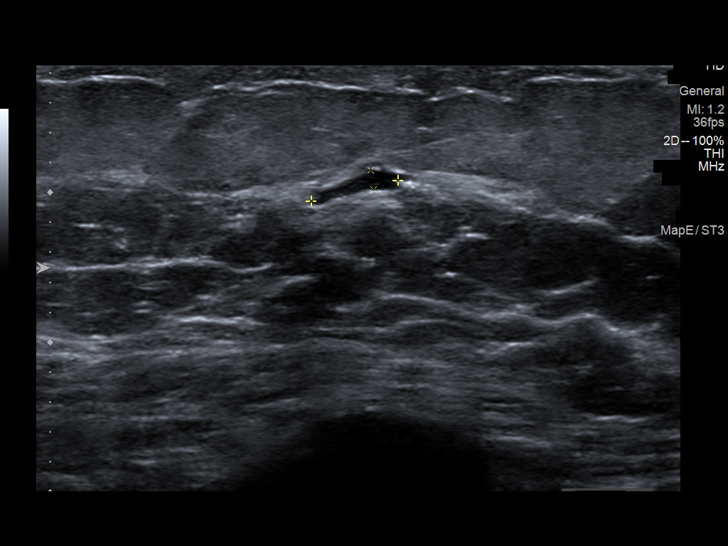
[im 12/18]
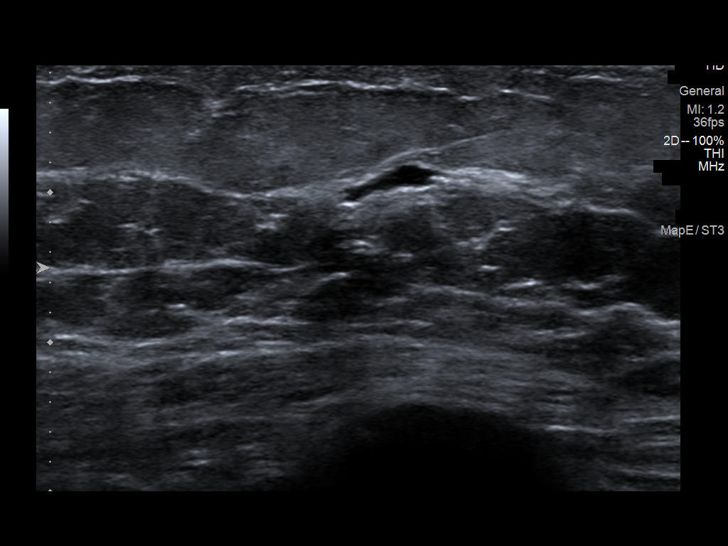
[im 14/18]
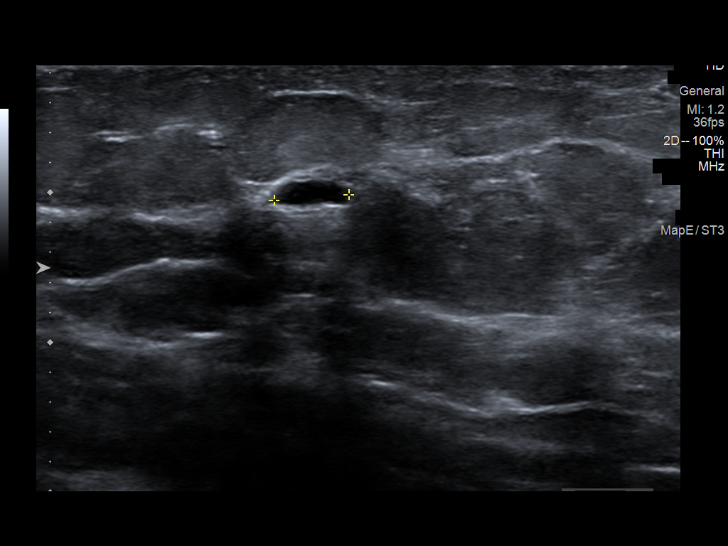
[im 15/18]
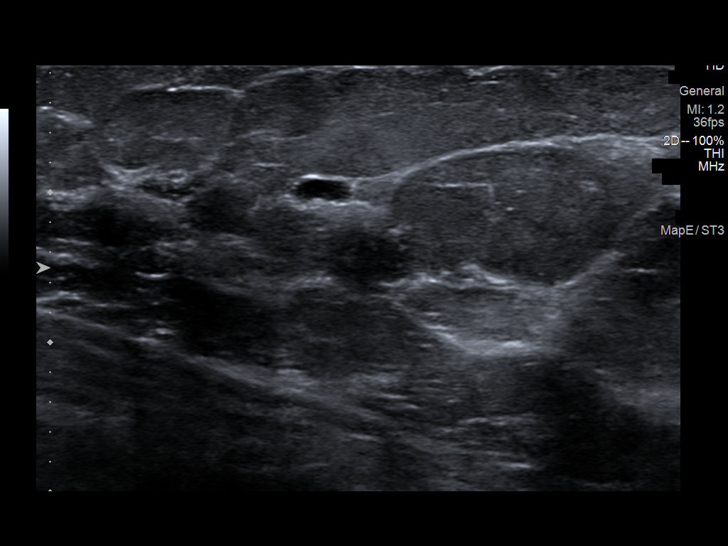
[im 16/18]
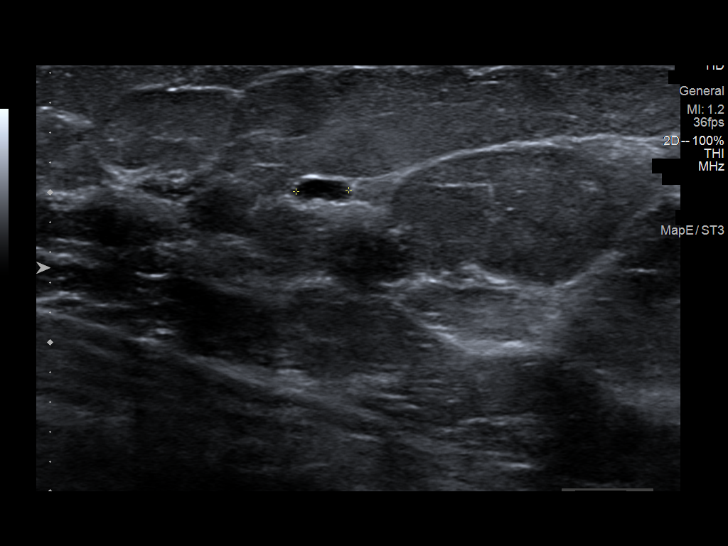
[im 18/18]
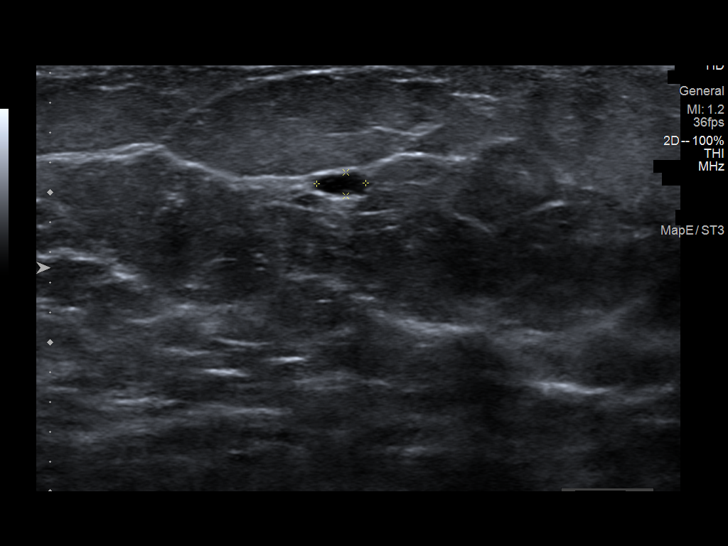

[13 of 18 positions shown; findings below may reference images not displayed]

ACR Breast Density Category b: There are scattered areas of
fibroglandular density.
FINDINGS: Mammogram:

Full field tomosynthesis views of the left breast were performed.
Stable mammographic appearance of the left breast. Unchanged small
oval circumscribed mass in the medial aspect of the breast. There
are no new suspicious findings.

Ultrasound:

Targeted ultrasound is performed in the left breast at 3 o'clock 5
cm from the nipple demonstrating an oval circumscribed hypoechoic
mass with thin septation measuring 0.6 x 0.3 x 0.7 cm, previously
measuring 1.1 x 0.4 x 0.8 cm. There is a similar smaller mass nearby
measuring 0.4 x 0.2 x 0.4 cm, not previously imaged.

At 9 o'clock 7 cm from the nipple there are subcentimeter oval
circumscribed anechoic masses consistent with benign simple cysts.
IMPRESSION: 1. Probably benign masses in the left breast at 3 o'clock, likely
clusters of cysts. The larger of these masses appears slightly
decreased in size from prior.

2.  Benign simple cysts in the left breast at 9 o'clock.

RECOMMENDATION:
Diagnostic bilateral mammogram and left breast ultrasound in 6
months.

I have discussed the findings and recommendations with the patient.
If applicable, a reminder letter will be sent to the patient
regarding the next appointment.

BI-RADS CATEGORY  3: Probably benign.

## 2023-03-01 ENCOUNTER — Other Ambulatory Visit: Payer: Self-pay | Admitting: Internal Medicine

## 2023-03-01 DIAGNOSIS — Z1231 Encounter for screening mammogram for malignant neoplasm of breast: Secondary | ICD-10-CM

## 2023-03-16 ENCOUNTER — Ambulatory Visit: Payer: 59

## 2023-03-30 ENCOUNTER — Ambulatory Visit
Admission: RE | Admit: 2023-03-30 | Discharge: 2023-03-30 | Disposition: A | Discharge status: Home / Self Care | Payer: 59 | Source: Ambulatory Visit | Attending: Internal Medicine | Admitting: Internal Medicine

## 2023-03-30 DIAGNOSIS — Z1231 Encounter for screening mammogram for malignant neoplasm of breast: Secondary | ICD-10-CM

## 2023-12-07 ENCOUNTER — Other Ambulatory Visit: Payer: Self-pay | Admitting: Internal Medicine

## 2023-12-07 DIAGNOSIS — Z1231 Encounter for screening mammogram for malignant neoplasm of breast: Secondary | ICD-10-CM

## 2024-03-02 ENCOUNTER — Ambulatory Visit: Admitting: Adult Health

## 2024-03-02 ENCOUNTER — Encounter: Payer: Self-pay | Admitting: Adult Health

## 2024-03-02 VITALS — BP 126/83 | HR 86 | Ht 64.5 in | Wt 192.0 lb

## 2024-03-02 DIAGNOSIS — Z975 Presence of (intrauterine) contraceptive device: Secondary | ICD-10-CM

## 2024-03-02 DIAGNOSIS — Z01419 Encounter for gynecological examination (general) (routine) without abnormal findings: Secondary | ICD-10-CM

## 2024-03-02 DIAGNOSIS — Z1212 Encounter for screening for malignant neoplasm of rectum: Secondary | ICD-10-CM | POA: Diagnosis not present

## 2024-03-02 DIAGNOSIS — Z1211 Encounter for screening for malignant neoplasm of colon: Secondary | ICD-10-CM | POA: Diagnosis not present

## 2024-03-02 DIAGNOSIS — F419 Anxiety disorder, unspecified: Secondary | ICD-10-CM

## 2024-03-02 DIAGNOSIS — T8332XA Displacement of intrauterine contraceptive device, initial encounter: Secondary | ICD-10-CM | POA: Diagnosis not present

## 2024-03-02 MED ORDER — ESCITALOPRAM OXALATE 10 MG PO TABS: 10.0000 mg | ORAL_TABLET | Freq: Every day | ORAL | 6 refills | Status: AC

## 2024-03-02 NOTE — Progress Notes (Signed)
 Patient ID: Zoe Hampton, female   DOB: 10-Apr-1975, 49 y.o.   MRN: [id number] History of Present Illness: Zoe Hampton is a 49 year old white female, married, G1P1001 in for a well woman gyn exam. She wants to get back on Lexapro , has some anxiety. Her mom has been at her house after a broken ankle.  She is going to [facility] for the weekend.      Component Value Date/Time   DIAGPAP  08/02/2022 1541    - Negative for intraepithelial lesion or malignancy (NILM)   DIAGPAP  11/20/2019 1550    - Negative for intraepithelial lesion or malignancy (NILM)   HPVHIGH Negative 08/02/2022 1541   HPVHIGH Negative 11/20/2019 1550   ADEQPAP  08/02/2022 1541    Satisfactory for evaluation; transformation zone component PRESENT.   ADEQPAP  11/20/2019 1550    Satisfactory for evaluation; transformation zone component PRESENT.    PCP is Dr Sheryle   Current Medications, Allergies, Past Medical History, Past Surgical History, Family History and Social History were reviewed in [facility] electronic medical record.     Review of Systems: Patient denies any headaches, hearing loss, fatigue, blurred vision, shortness of breath, chest pain, abdominal pain, problems with bowel movements, urination, or intercourse. No joint pain or mood swings.  No periods with IUD +anxiety at times   Physical Exam:BP 126/83 (BP Location: Right Arm, Patient Position: Sitting, Cuff Size: Normal)   Pulse 86   Ht 5' 4.5 (1.638 m)   Wt 192 lb (87.1 kg)   BMI 32.45 kg/m   General:  Well developed, well nourished, no acute distress Skin:  Warm and dry Neck:  Midline trachea, normal thyroid, good ROM, no lymphadenopathy Lungs; Clear to auscultation bilaterally Breast:  No dominant palpable mass, retraction, or nipple discharge Cardiovascular: Regular rate and rhythm Abdomen:  Soft, non tender, no hepatosplenomegaly Pelvic:  External genitalia is normal in appearance, no lesions.  The vagina is normal in appearance.  Urethra has no lesions or masses. The cervix is smooth, no IUD strings,informal abdominal POC US  shows IUD in place.  Uterus is felt to be normal size, shape, and contour.  No adnexal masses or tenderness noted.Bladder is non tender, no masses felt. Extremities/musculoskeletal:  No swelling or varicosities noted, no clubbing or cyanosis Psych:  No mood changes, alert and cooperative,seems happy AA is 1 Fall risk is low    03/02/2024    9:55 AM 08/02/2022    3:37 PM 11/20/2019    3:47 PM  Depression screen PHQ 2/9  Decreased Interest 0 0 0  Down, Depressed, Hopeless 0 0 0  PHQ - 2 Score 0 0 0  Altered sleeping 0 0 1  Tired, decreased energy 0 0 0  Change in appetite 0 0 0  Feeling bad or failure about yourself  0 0 0  Trouble concentrating 0 0 0  Moving slowly or fidgety/restless 0 0 0  Suicidal thoughts 0 0 0  PHQ-9 Score 0 0  1   Difficult doing work/chores   Not difficult at all     Data saved with a previous flowsheet row definition       03/02/2024    9:55 AM 08/02/2022    3:37 PM 11/20/2019    3:47 PM  GAD 7 : Generalized Anxiety Score  Nervous, Anxious, on Edge 1 1 1   Control/stop worrying 1 0 1  Worry too much - different things 1 1 1   Trouble relaxing 1 0 0  Restless 0  0 0  Easily annoyed or irritable 1 0 0  Afraid - awful might happen 0 0 0  Total GAD 7 Score 5 2 3   Anxiety Difficulty   Not difficult at all      Upstream - 03/02/24 0936       Pregnancy Intention Screening   Does the patient want to become pregnant in the next year? No    Does the patient's partner want to become pregnant in the next year? No    Would the patient like to discuss contraceptive options today? No      Contraception Wrap Up   Current Method IUD or IUS    End Method IUD or IUS    Contraception Counseling Provided Yes         Examination chaperoned by Clarita Salt LPN   Impression and plan: 1. Encounter for well woman exam with routine gynecological exam (Primary) Pap in  2027 Physical in 1 year Labs with PCP  2. Intrauterine contraceptive device threads lost, initial encounter No strings IUD in place on US    3. IUD (intrauterine device) in place Mirena  placed 03/27/20  4. Anxiety Anxiety at times Will rx lexapro  10 mg 1 daily Meds ordered this encounter  Medications   escitalopram  (LEXAPRO ) 10 MG tablet    Sig: Take 1 tablet (10 mg total) by mouth daily.    Dispense:  30 tablet    Refill:  6    Supervising Provider:   JAYNE VONN DEL [[id number]]   Can message if needs refills or increase  5. Screening for colorectal cancer Referred for colonoscopy  - Ambulatory referral to Gastroenterology

## 2024-03-05 ENCOUNTER — Encounter (INDEPENDENT_AMBULATORY_CARE_PROVIDER_SITE_OTHER): Payer: Self-pay | Admitting: *Deleted

## 2024-04-02 ENCOUNTER — Ambulatory Visit
